# Patient Record
Sex: Male | Born: 1965
Health system: Southern US, Community
[De-identification: ages and names within clinical notes are randomized; demographics above are authoritative.]

## PROBLEM LIST (undated history)

## (undated) DIAGNOSIS — T7840XA Allergy, unspecified, initial encounter: Secondary | ICD-10-CM

## (undated) DIAGNOSIS — K5792 Diverticulitis of intestine, part unspecified, without perforation or abscess without bleeding: Secondary | ICD-10-CM

## (undated) HISTORY — DX: Allergy, unspecified, initial encounter: T78.40XA

## (undated) HISTORY — DX: Diverticulitis of intestine, part unspecified, without perforation or abscess without bleeding: K57.92

---

## 1998-06-26 ENCOUNTER — Encounter: Payer: Self-pay | Admitting: Family Medicine

## 1998-06-26 ENCOUNTER — Ambulatory Visit (HOSPITAL_COMMUNITY): Admission: RE | Admit: 1998-06-26 | Discharge: 1998-06-26 | Payer: Self-pay | Admitting: Family Medicine

## 1999-06-26 ENCOUNTER — Emergency Department (HOSPITAL_COMMUNITY): Admission: EM | Admit: 1999-06-26 | Discharge: 1999-06-27 | Payer: Self-pay | Admitting: Internal Medicine

## 2002-12-13 ENCOUNTER — Ambulatory Visit (HOSPITAL_BASED_OUTPATIENT_CLINIC_OR_DEPARTMENT_OTHER): Admission: RE | Admit: 2002-12-13 | Discharge: 2002-12-13 | Payer: Self-pay | Admitting: Plastic Surgery

## 2002-12-13 ENCOUNTER — Encounter (INDEPENDENT_AMBULATORY_CARE_PROVIDER_SITE_OTHER): Payer: Self-pay | Admitting: Specialist

## 2005-04-18 ENCOUNTER — Ambulatory Visit: Payer: Self-pay | Admitting: Family Medicine

## 2005-05-13 ENCOUNTER — Ambulatory Visit: Payer: Self-pay | Admitting: Family Medicine

## 2005-05-14 ENCOUNTER — Ambulatory Visit: Payer: Self-pay | Admitting: Family Medicine

## 2005-08-15 ENCOUNTER — Ambulatory Visit: Payer: Self-pay | Admitting: Family Medicine

## 2006-03-12 ENCOUNTER — Ambulatory Visit: Payer: Self-pay | Admitting: Family Medicine

## 2006-04-06 ENCOUNTER — Ambulatory Visit: Payer: Self-pay | Admitting: Family Medicine

## 2006-04-07 HISTORY — PX: UMBILICAL HERNIA REPAIR: SHX196

## 2006-05-22 ENCOUNTER — Ambulatory Visit (HOSPITAL_COMMUNITY): Admission: RE | Admit: 2006-05-22 | Discharge: 2006-05-22 | Payer: Self-pay | Admitting: General Surgery

## 2006-07-27 ENCOUNTER — Ambulatory Visit: Payer: Self-pay | Admitting: Family Medicine

## 2006-12-23 ENCOUNTER — Ambulatory Visit: Payer: Self-pay | Admitting: Family Medicine

## 2006-12-23 DIAGNOSIS — J309 Allergic rhinitis, unspecified: Secondary | ICD-10-CM | POA: Insufficient documentation

## 2006-12-23 DIAGNOSIS — J019 Acute sinusitis, unspecified: Secondary | ICD-10-CM | POA: Insufficient documentation

## 2007-02-23 ENCOUNTER — Ambulatory Visit: Payer: Self-pay | Admitting: Family Medicine

## 2007-05-04 ENCOUNTER — Telehealth: Payer: Self-pay | Admitting: Family Medicine

## 2007-05-10 ENCOUNTER — Ambulatory Visit: Payer: Self-pay | Admitting: Family Medicine

## 2007-05-10 DIAGNOSIS — J45909 Unspecified asthma, uncomplicated: Secondary | ICD-10-CM | POA: Insufficient documentation

## 2007-05-10 DIAGNOSIS — J209 Acute bronchitis, unspecified: Secondary | ICD-10-CM | POA: Insufficient documentation

## 2007-05-19 ENCOUNTER — Telehealth: Payer: Self-pay | Admitting: Family Medicine

## 2007-05-31 ENCOUNTER — Ambulatory Visit: Payer: Self-pay | Admitting: Family Medicine

## 2008-01-07 ENCOUNTER — Ambulatory Visit: Payer: Self-pay | Admitting: Family Medicine

## 2008-01-24 ENCOUNTER — Telehealth: Payer: Self-pay | Admitting: Family Medicine

## 2008-04-21 ENCOUNTER — Telehealth: Payer: Self-pay | Admitting: Family Medicine

## 2008-04-24 ENCOUNTER — Ambulatory Visit: Payer: Self-pay | Admitting: Family Medicine

## 2008-04-24 DIAGNOSIS — IMO0002 Reserved for concepts with insufficient information to code with codable children: Secondary | ICD-10-CM | POA: Insufficient documentation

## 2008-04-28 ENCOUNTER — Telehealth: Payer: Self-pay | Admitting: Family Medicine

## 2009-01-10 ENCOUNTER — Telehealth: Payer: Self-pay | Admitting: Family Medicine

## 2009-01-16 ENCOUNTER — Telehealth: Payer: Self-pay | Admitting: Family Medicine

## 2009-01-18 ENCOUNTER — Encounter: Payer: Self-pay | Admitting: Family Medicine

## 2009-05-02 ENCOUNTER — Telehealth: Payer: Self-pay | Admitting: Family Medicine

## 2010-03-06 ENCOUNTER — Ambulatory Visit: Payer: Self-pay | Admitting: Family Medicine

## 2010-03-06 LAB — CONVERTED CEMR LAB
Bilirubin Urine: NEGATIVE
Blood in Urine, dipstick: NEGATIVE
Glucose, Urine, Semiquant: NEGATIVE
Ketones, urine, test strip: NEGATIVE
Nitrite: NEGATIVE
Protein, U semiquant: NEGATIVE
Specific Gravity, Urine: 1.015
Urobilinogen, UA: 0.2
WBC Urine, dipstick: NEGATIVE
pH: 6

## 2010-03-07 LAB — CONVERTED CEMR LAB
ALT: 19 units/L (ref 0–53)
AST: 24 units/L (ref 0–37)
Albumin: 4.2 g/dL (ref 3.5–5.2)
Alkaline Phosphatase: 70 units/L (ref 39–117)
BUN: 15 mg/dL (ref 6–23)
Basophils Absolute: 0 10*3/uL (ref 0.0–0.1)
Basophils Relative: 0.4 % (ref 0.0–3.0)
Bilirubin, Direct: 0.2 mg/dL (ref 0.0–0.3)
CO2: 32 meq/L (ref 19–32)
Calcium: 9.3 mg/dL (ref 8.4–10.5)
Chloride: 102 meq/L (ref 96–112)
Cholesterol: 235 mg/dL — ABNORMAL HIGH (ref 0–200)
Creatinine, Ser: 0.9 mg/dL (ref 0.4–1.5)
Direct LDL: 154.2 mg/dL
Eosinophils Absolute: 0.2 10*3/uL (ref 0.0–0.7)
Eosinophils Relative: 4.3 % (ref 0.0–5.0)
GFR calc non Af Amer: 94.83 mL/min (ref >60)
Glucose, Bld: 91 mg/dL (ref 70–99)
HCT: 44.8 % (ref 39.0–52.0)
HDL: 57 mg/dL (ref >39.00)
Hemoglobin: 15.6 g/dL (ref 13.0–17.0)
Lymphocytes Relative: 29 % (ref 12.0–46.0)
Lymphs Abs: 1.4 10*3/uL (ref 0.7–4.0)
MCHC: 34.9 g/dL (ref 30.0–36.0)
MCV: 91.3 fL (ref 78.0–100.0)
Monocytes Absolute: 0.4 10*3/uL (ref 0.1–1.0)
Monocytes Relative: 8.8 % (ref 3.0–12.0)
Neutro Abs: 2.7 10*3/uL (ref 1.4–7.7)
Neutrophils Relative %: 57.5 % (ref 43.0–77.0)
Platelets: 201 10*3/uL (ref 150.0–400.0)
Potassium: 4.2 meq/L (ref 3.5–5.1)
RBC: 4.9 M/uL (ref 4.22–5.81)
RDW: 12.5 % (ref 11.5–14.6)
Sodium: 142 meq/L (ref 135–145)
TSH: 1.99 microintl units/mL (ref 0.35–5.50)
Total Bilirubin: 1.7 mg/dL — ABNORMAL HIGH (ref 0.3–1.2)
Total CHOL/HDL Ratio: 4
Total Protein: 7.1 g/dL (ref 6.0–8.3)
Triglycerides: 112 mg/dL (ref 0.0–149.0)
VLDL: 22.4 mg/dL (ref 0.0–40.0)
WBC: 4.7 10*3/uL (ref 4.5–10.5)

## 2010-03-19 ENCOUNTER — Telehealth: Payer: Self-pay | Admitting: Family Medicine

## 2010-04-16 ENCOUNTER — Telehealth: Payer: Self-pay | Admitting: Family Medicine

## 2010-05-07 NOTE — Assessment & Plan Note (Signed)
Summary: CPX (PT WILL COME IN FASTING) // RS   Vital Signs:  Patient profile:   45 year old male Height:      69 inches Weight:      166 pounds BMI:     24.60 O2 Sat:      99 % Temp:     98 degrees F Pulse rate:   73 / minute BP sitting:   120 / 80  (left arm) Cuff size:   regular  Vitals Entered By: Pura Spice, RN (March 06, 2010 9:08 AM) CC: cpx sinus problems    History of Present Illness: 45 yr old male for a cpx. He feels fine and has no complaints.   Preventive Screening-Counseling & Management  Alcohol-Tobacco     Smoking Status: never  Allergies: 1)  ! Penicillin  Past History:  Past Medical History: Allergic rhinitis Asthma acne, sees Dr. Elmon Else   Past Surgical History: Reviewed history from 04/24/2008 and no changes required. umbilical hernia repair 2007 Hand surgery (repair nerve and ligaments) Arthroscopy to lt knee  Family History: Reviewed history and no changes required. Family History High cholesterol Family History Hypertension  Social History: Reviewed history and no changes required. Married Never Smoked Alcohol use-yes Smoking Status:  never  Review of Systems  The patient denies anorexia, fever, weight loss, weight gain, vision loss, decreased hearing, hoarseness, chest pain, syncope, dyspnea on exertion, peripheral edema, prolonged cough, headaches, hemoptysis, abdominal pain, melena, hematochezia, severe indigestion/heartburn, hematuria, incontinence, genital sores, muscle weakness, suspicious skin lesions, transient blindness, difficulty walking, depression, unusual weight change, abnormal bleeding, enlarged lymph nodes, angioedema, breast masses, and testicular masses.    Physical Exam  General:  Well-developed,well-nourished,in no acute distress; alert,appropriate and cooperative throughout examination Head:  Normocephalic and atraumatic without obvious abnormalities. No apparent alopecia or balding. Eyes:  No corneal  or conjunctival inflammation noted. EOMI. Perrla. Funduscopic exam benign, without hemorrhages, exudates or papilledema. Vision grossly normal. Ears:  External ear exam shows no significant lesions or deformities.  Otoscopic examination reveals clear canals, tympanic membranes are intact bilaterally without bulging, retraction, inflammation or discharge. Hearing is grossly normal bilaterally. Nose:  External nasal examination shows no deformity or inflammation. Nasal mucosa are pink and moist without lesions or exudates. Mouth:  Oral mucosa and oropharynx without lesions or exudates.  Teeth in good repair. Neck:  No deformities, masses, or tenderness noted. Chest Wall:  No deformities, masses, tenderness or gynecomastia noted. Lungs:  Normal respiratory effort, chest expands symmetrically. Lungs are clear to auscultation, no crackles or wheezes. Heart:  Normal rate and regular rhythm. S1 and S2 normal without gallop, murmur, click, rub or other extra sounds. Abdomen:  Bowel sounds positive,abdomen soft and non-tender without masses, organomegaly or hernias noted. Genitalia:  Testes bilaterally descended without nodularity, tenderness or masses. No scrotal masses or lesions. No penis lesions or urethral discharge. Msk:  No deformity or scoliosis noted of thoracic or lumbar spine.   Pulses:  R and L carotid,radial,femoral,dorsalis pedis and posterior tibial pulses are full and equal bilaterally Extremities:  No clubbing, cyanosis, edema, or deformity noted with normal full range of motion of all joints.   Neurologic:  No cranial nerve deficits noted. Station and gait are normal. Plantar reflexes are down-going bilaterally. DTRs are symmetrical throughout. Sensory, motor and coordinative functions appear intact. Skin:  Intact without suspicious lesions or rashes Cervical Nodes:  No lymphadenopathy noted Axillary Nodes:  No palpable lymphadenopathy Inguinal Nodes:  No significant adenopathy Psych:  Cognition and judgment appear intact. Alert and cooperative with normal attention span and concentration. No apparent delusions, illusions, hallucinations   Impression & Recommendations:  Problem # 1:  HEALTH MAINTENANCE EXAM (ICD-V70.0)  Orders: UA Dipstick w/o Micro (automated)  (81003) Venipuncture (44010) TLB-Lipid Panel (80061-LIPID) TLB-BMP (Basic Metabolic Panel-BMET) (80048-METABOL) TLB-CBC Platelet - w/Differential (85025-CBCD) TLB-Hepatic/Liver Function Pnl (80076-HEPATIC) TLB-TSH (Thyroid Stimulating Hormone) (84443-TSH) Specimen Handling (27253)  Complete Medication List: 1)  Ventolin Hfa 108 (90 Base) Mcg/act Aers (Albuterol sulfate) .... 2 puffs q 4 hours as needed 2)  Flonase 50 Mcg/act Susp (Fluticasone propionate) .... 2 sprays each nostril once daily 3)  Singulair 10 Mg Tabs (Montelukast sodium) .... Once daily 4)  Allegra-d 24 Hour 180-240 Mg Xr24h-tab (Fexofenadine-pseudoephedrine) .Marland Kitchen.. 1 by mouth once daily 5)  Minocycline Hcl 100 Mg Caps (Minocycline hcl) .... Two times a day  Patient Instructions: 1)  Please schedule a follow-up appointment as needed .  Prescriptions: VENTOLIN HFA 108 (90 BASE) MCG/ACT  AERS (ALBUTEROL SULFATE) 2 puffs q 4 hours as needed  #1 x 5   Entered and Authorized by:   Nelwyn Salisbury MD   Signed by:   Nelwyn Salisbury MD on 03/06/2010   Method used:   Electronically to        [facility] [address] [phone]* (retail)       [address] [place], [place]  [address]       Ph: [phone]       Fax: [phone]   RxID:   [id number]    Orders Added: 1)  Est. Patient 40-64 years [99396] 2)  UA Dipstick w/o Micro (automated)  [81003] 3)  Venipuncture [57322] 4)  TLB-Lipid Panel [80061-LIPID] 5)  TLB-BMP (Basic Metabolic Panel-BMET) [80048-METABOL] 6)  TLB-CBC Platelet - w/Differential [85025-CBCD] 7)  TLB-Hepatic/Liver Function Pnl [80076-HEPATIC] 8)  TLB-TSH (Thyroid Stimulating Hormone) [84443-TSH] 9)  Specimen  Handling [99000]    Laboratory Results   Urine Tests  Date/Time Received: March 06, 2010   Routine Urinalysis   Color: yellow Appearance: Clear Glucose: negative   (Normal Range: Negative) Bilirubin: negative   (Normal Range: Negative) Ketone: negative   (Normal Range: Negative) Spec. Gravity: 1.015   (Normal Range: 1.003-1.035) Blood: negative   (Normal Range: Negative) pH: 6.0   (Normal Range: 5.0-8.0) Protein: negative   (Normal Range: Negative) Urobilinogen: 0.2   (Normal Range: 0-1) Nitrite: negative   (Normal Range: Negative) Leukocyte Esterace: negative   (Normal Range: Negative)    Comments: Kern Reap CMA (AAMA)  March 06, 2010 1:33 PM

## 2010-05-07 NOTE — Progress Notes (Signed)
Summary: refill zpak?  Phone Note Call from Patient Call back at Home Phone [phone]   Caller: Patient-live call Summary of Call: Pt calls with his typical sinus infection. Cannot come in today. Has a flight to catch in the morning . Requesting a refill of Z[facility]ak to [facility] on [address]. Please advise. Initial call taken by: Warnell Forester,  May 02, 2009 9:35 AM  Follow-up for Phone Call        call in a Zpack Follow-up by: Nelwyn Salisbury MD,  May 02, 2009 1:35 PM  Additional Follow-up for Phone Call Additional follow up Details #1::        Phone Call Completed, Rx Called In Additional Follow-up by: Alfred Levins, CMA,  May 02, 2009 1:56 PM    New/Updated Medications: ZITHROMAX Z-PAK 250 MG  TABS (AZITHROMYCIN) Use as directed Prescriptions: ZITHROMAX Z-PAK 250 MG  TABS (AZITHROMYCIN) Use as directed  #1 x 0   Entered by:   Alfred Levins, CMA   Authorized by:   Nelwyn Salisbury MD   Signed by:   Alfred Levins, CMA on 05/02/2009   Method used:   Electronically to        [facility]. [phone]* (retail)       [address] [place], [place]  [address]       Ph: [phone]       Fax: [phone]   RxID:   [id number]

## 2010-05-09 NOTE — Progress Notes (Signed)
Summary: Brent Bennett INTO [facility]  Phone Note Call from Patient Call back at Home Phone [phone]   Caller: Patient Call For: Nelwyn Salisbury MD Summary of Call: PT IS STILL HAVING SINUS PROBLEMS  REQUESTING [name] [address] [phone] Initial call taken by: Heron Sabins,  March 19, 2010 9:36 AM  Follow-up for Phone Call        call in a Zpack Follow-up by: Nelwyn Salisbury MD,  March 19, 2010 12:00 PM    New/Updated Medications: ZITHROMAX Z-PAK 250 MG TABS (AZITHROMYCIN) as directed Prescriptions: ZITHROMAX Z-PAK 250 MG TABS (AZITHROMYCIN) as directed  #6 x 0   Entered by:   Lynann Beaver CMA AAMA   Authorized by:   Nelwyn Salisbury MD   Signed by:   Lynann Beaver CMA AAMA on 03/19/2010   Method used:   Electronically to        [facility] [address] [phone]* (retail)       [address] [place], [place]  [address]       Ph: [phone]       Fax: [phone]   RxID:   [id number]

## 2010-05-09 NOTE — Progress Notes (Signed)
Summary: rx z pack   Phone Note Call from Patient Call back at Home Phone [phone]   Caller: Patient Summary of Call: out of town. Having typical sinus infection symptoms with pressure, PND, ST, and a cough. No fever.  Initial call taken by: Nelwyn Salisbury MD,  April 16, 2010 9:39 AM  Follow-up for Phone Call        call in a Zpack to [facility] [address].  Follow-up by: Nelwyn Salisbury MD,  April 16, 2010 9:40 AM  Additional Follow-up for Phone Call Additional follow up Details #1::        pt notified  called [facility]  Additional Follow-up by: Pura Spice, RN,  April 16, 2010 10:03 AM    New/Updated Medications: AZITHROMYCIN 250 MG  TABS (AZITHROMYCIN) 2 by  mouth today and then 1 daily for 4 days Prescriptions: AZITHROMYCIN 250 MG  TABS (AZITHROMYCIN) 2 by  mouth today and then 1 daily for 4 days  #6 x 0   Entered by:   Pura Spice, RN   Authorized by:   Nelwyn Salisbury MD   Signed by:   Pura Spice, RN on 04/16/2010   Method used:   Electronically to        [facility] [address] [phone]* (retail)       [address] [place], [place]  [address]       Ph: [phone]       Fax: [phone]   RxID:   [id number]

## 2010-05-17 ENCOUNTER — Other Ambulatory Visit: Payer: Self-pay | Admitting: Dermatology

## 2010-05-21 ENCOUNTER — Ambulatory Visit (INDEPENDENT_AMBULATORY_CARE_PROVIDER_SITE_OTHER): Payer: Self-pay | Admitting: Internal Medicine

## 2010-05-21 ENCOUNTER — Encounter: Payer: Self-pay | Admitting: Internal Medicine

## 2010-05-21 DIAGNOSIS — H6690 Otitis media, unspecified, unspecified ear: Secondary | ICD-10-CM

## 2010-05-21 DIAGNOSIS — H6692 Otitis media, unspecified, left ear: Secondary | ICD-10-CM

## 2010-05-21 MED ORDER — LEVOFLOXACIN 500 MG PO TABS: 500.0000 mg | ORAL_TABLET | Freq: Every day | ORAL | Status: AC

## 2010-05-21 NOTE — Progress Notes (Signed)
  Subjective:    Patient ID: Brent Bennett, male    DOB: 03/18/1966, 45 y.o.   MRN: [id number]  HPI Pt presents to clinic for evaluation of ear discomfort. Notes chronic h/o allergic rhinitis with recent increase in congestion. Several days ago blew his nose and noted left ear pressure and decreased hearing. No ear drainage, fever or chills. No injury or trauma. No alleviating or exacerbating factors. No other complaints.  Reviewed PMH, medications and allergies    Review of Systems  Constitutional: Negative for fever and chills.  HENT: Positive for hearing loss, ear pain, congestion and rhinorrhea. Negative for facial swelling and ear discharge.   Eyes: Negative for discharge and redness.  Respiratory: Negative for cough.        Objective:   Physical Exam  Constitutional: He appears well-developed and well-nourished. No distress.  HENT:  Head: Normocephalic and atraumatic.  Right Ear: Tympanic membrane, external ear and ear canal normal. No lacerations. No drainage, swelling or tenderness. No foreign bodies. Tympanic membrane is not erythematous and not retracted.  Left Ear: External ear and ear canal normal. No lacerations. No drainage, swelling or tenderness. No foreign bodies. Tympanic membrane is erythematous and retracted.  Ears:  Nose: Nose normal.  Mouth/Throat: Oropharynx is clear and moist. No oropharyngeal exudate.       Significant TM erythema with area of central clearing. +light reflex along central area NOT c/w perforation.  Eyes: Right eye exhibits no discharge. Left eye exhibits no discharge. No scleral icterus.  Neurological: He is alert.  Skin: Skin is warm and dry. He is not diaphoretic.  Psychiatric: He has a normal mood and affect.          Assessment & Plan:

## 2010-05-21 NOTE — Assessment & Plan Note (Signed)
Begin levaquin 500mg  po qd x7d. Followup closely for re-evaluation if no improvement or worsening.

## 2010-05-24 ENCOUNTER — Other Ambulatory Visit: Payer: Self-pay | Admitting: Family Medicine

## 2010-05-24 MED ORDER — FLUTICASONE PROPIONATE 50 MCG/ACT NA SUSP: 2.0000 | Freq: Every day | NASAL | Status: DC

## 2010-05-24 NOTE — Telephone Encounter (Signed)
Triage vm-----please refill Flonase NS.  RX has expired. It is over a year old. Please return call.

## 2010-05-24 NOTE — Telephone Encounter (Signed)
Pt notiifed flonase called to [facility] [address]

## 2010-05-30 ENCOUNTER — Other Ambulatory Visit: Payer: Self-pay | Admitting: Family Medicine

## 2010-07-09 ENCOUNTER — Other Ambulatory Visit: Payer: Self-pay | Admitting: Family Medicine

## 2010-08-23 NOTE — Op Note (Signed)
   NAME:  Brent Bennett, Brent Bennett                      ACCOUNT NO.:  [id number]   MEDICAL RECORD NO.:  [id number]                   PATIENT TYPE:  AMB   LOCATION:  DSC                                  FACILITY:  [facility]   PHYSICIAN:  Alfredia Ferguson, M.D.               DATE OF BIRTH:  09/27/1965   DATE OF PROCEDURE:  12/13/2002  DATE OF DISCHARGE:                                 OPERATIVE REPORT   PREOPERATIVE DIAGNOSIS:  9 mm biopsy-proven dysplastic nevus right lower  cheek.   POSTOPERATIVE DIAGNOSIS:  9 mm biopsy-proven dysplastic nevus right lower  cheek.   PROCEDURE:  Elliptical excision of biopsy site of dysplastic nevus right  cheek with 3 mm margins.   SURGEON:  Alfredia Ferguson, M.D.   ANESTHESIA:  2% Xylocaine with 1:100,000 epinephrine.   INDICATIONS FOR PROCEDURE:  This is a 45 year old gentleman with a recent  biopsy of a pigmented nevus on his right lower cheek.  Pathology returned  dysplastic nevus with marked atypical bordering on melanoma in situ.  Recommendation for conservative reexcision was made.  The patient  understands he will be trading what he has for a permanent and potentially  unsightly scar.  Inspite of that, the patient wishes to proceed with the  surgery.   DESCRIPTION OF PROCEDURE:  Skin marks were placed around the lesion in an  elliptical fashion with 3 mm margins.  Local anesthesia using 2% Xylocaine  with 1:100,000 epinephrine was infiltrated.  The right cheek was prepped  with Betadine and draped with sterile drapes.  After waiting approximately  10 minutes, an elliptical excision of the lesion down to the level of the  subcutaneous tissue was carried out. Hemostasis was accomplished using  pressure. The wound edges were undermined for a distance of several mm in  all directions.  The wound was closed by approximating the dermis using  multiple interrupted 4-0 Monocryl sutures.  Skin was united using a running  6-0 nylon suture.  A light dressing  was applied and the patient was  discharged to home in satisfactory condition.                                               Alfredia Ferguson, M.D.    WBB/MEDQ  D:  12/13/2002  T:  12/13/2002  Job:  [id number]   cc:   Elmon Else, M.D.

## 2010-08-23 NOTE — Op Note (Signed)
Brent Bennett, Brent Bennett            ACCOUNT NO.:  [id number]   MEDICAL RECORD NO.:  [id number]          PATIENT TYPE:  AMB   LOCATION:  SDS                          FACILITY:  [facility]   PHYSICIAN:  Ollen Gross. Vernell Morgans, M.D. DATE OF BIRTH:  Sep 21, 1965   DATE OF PROCEDURE:  05/22/2006  DATE OF DISCHARGE:                               OPERATIVE REPORT   PREOPERATIVE DIAGNOSIS:  Umbilical hernia.   POSTOPERATIVE DIAGNOSES:  Umbilical hernia.   PROCEDURE:  Umbilical hernia repair with mesh.   SURGEON:  Dr. Carolynne Edouard   ANESTHESIA:  General endotracheal.   PROCEDURE:  After informed consent was obtained, the patient was brought  to the operating room, placed in supine position on the operating room  table.  After adequate induction of general anesthesia, the patient's  abdomen was prepped with Betadine and draped in usual sterile manner.  The area around the umbilicus was infiltrated with 0.25% Marcaine with  epinephrine.  A small transverse supraumbilical incision was made with a  15 blade knife.  This incision was carried down through the skin and  subcutaneous tissue sharply with electrocautery until the linea alba was  identified.  There was a small bit of preperitoneal fat that had  herniated through a small defect at the umbilicus and this was excised  sharply with electrocautery.  The patient actually had two or three  other very small 1-2 mm defects just within a couple of millimeters of  the umbilical defect.  All of these defects were again 1-2 mm.  Because  there were several defects, each of the defects was connected and the  fascial bridges between were opened sharply with electrocautery.  The  edges were cleaned of any fatty debris, again sharply with the  electrocautery, as well as with some blunt finger dissection.  Once this  was accomplished, the edges of the defect were very clean and healthy  appearing.  The defect itself was then closed with a with interrupted #1  Novofil  stitches.  The wound was irrigated with copious amounts of  saline.  A small piece of Ultrapro mesh was then chosen to cover the  repair.  The mesh was anchored in six places around the edge of the mesh  with interrupted #1 Novofil stitches and a couple of places along the  repair also with interrupted #1 Novofil stitches.  Once this  accomplished, the wound again was irrigated copious amounts of saline.  The deep layer of the wound was then closed with interrupted 3-0 Vicryl  stitches and the skin was closed with a running 4-0 Monocryl  subcuticular stitch.  Benzoin, Steri-Strips and sterile dressings were  applied.    The patient tolerated well.  At the end of the case all needle, sponge  and instrument counts were correct.  The patient was then awakened,  taken recovery room in stable condition.     Ollen Gross. Vernell Morgans, M.D.  Electronically Signed    PST/MEDQ  D:  05/22/2006  T:  05/22/2006  Job:  [id number]

## 2010-11-04 ENCOUNTER — Other Ambulatory Visit: Payer: Self-pay | Admitting: Family Medicine

## 2010-11-04 NOTE — Telephone Encounter (Signed)
Script sent, e-scribe. 

## 2010-11-11 ENCOUNTER — Ambulatory Visit (INDEPENDENT_AMBULATORY_CARE_PROVIDER_SITE_OTHER): Payer: BC Managed Care – PPO | Admitting: Family Medicine

## 2010-11-11 ENCOUNTER — Encounter: Payer: Self-pay | Admitting: Family Medicine

## 2010-11-11 VITALS — BP 112/78 | HR 71 | Temp 98.3°F | Wt 168.0 lb

## 2010-11-11 DIAGNOSIS — L259 Unspecified contact dermatitis, unspecified cause: Secondary | ICD-10-CM

## 2010-11-11 DIAGNOSIS — L0390 Cellulitis, unspecified: Secondary | ICD-10-CM

## 2010-11-11 DIAGNOSIS — L0291 Cutaneous abscess, unspecified: Secondary | ICD-10-CM

## 2010-11-11 MED ORDER — DOXYCYCLINE HYCLATE 100 MG PO CAPS: 100.0000 mg | ORAL_CAPSULE | Freq: Two times a day (BID) | ORAL | Status: AC

## 2010-11-11 NOTE — Progress Notes (Signed)
  Subjective:    Patient ID: Brent Bennett, male    DOB: 06/24/1965, 45 y.o.   MRN: [id number]  HPI Here for an itchy rash which appeared on the forearms and face 2 days ago after working in the yard. Then yesterday the area on the chin became red, swollen, and tender. No fever.    Review of Systems  Constitutional: Negative.   Skin: Positive for rash.       Objective:   Physical Exam  Constitutional: He appears well-developed and well-nourished.  Skin:       The left lower cheek and the chin have patches of erythema, yellow crusting, and tenderness. The forearms have patches of red papulovessicular rashes           Assessment & Plan:  He has contact dermatitis, with an area on the chin that has a secondary cellulitis. Use OTC cortisone cream on the arms. Use Doxycycline for the infection.

## 2011-03-05 ENCOUNTER — Telehealth: Payer: Self-pay | Admitting: Family Medicine

## 2011-03-05 MED ORDER — AZITHROMYCIN 250 MG PO TABS: ORAL_TABLET | ORAL | Status: AC

## 2011-03-05 NOTE — Telephone Encounter (Signed)
Script called in and left voice message for pt. 

## 2011-03-05 NOTE — Telephone Encounter (Signed)
He is calling in about a sinus infection. Please call  in a Zpack

## 2011-03-20 ENCOUNTER — Telehealth: Payer: Self-pay | Admitting: Family Medicine

## 2011-03-20 MED ORDER — AZITHROMYCIN 250 MG PO TABS: ORAL_TABLET | ORAL | Status: DC

## 2011-03-20 NOTE — Telephone Encounter (Signed)
He needs another Zpack for the sinus infection. He is better but not well yet. Please call in another Zpack

## 2011-03-20 NOTE — Telephone Encounter (Signed)
Script called in and pt aware. 

## 2011-03-22 ENCOUNTER — Encounter: Payer: Self-pay | Admitting: Family Medicine

## 2011-03-22 ENCOUNTER — Ambulatory Visit (INDEPENDENT_AMBULATORY_CARE_PROVIDER_SITE_OTHER): Payer: BC Managed Care – PPO | Admitting: Family Medicine

## 2011-03-22 DIAGNOSIS — J329 Chronic sinusitis, unspecified: Secondary | ICD-10-CM

## 2011-03-22 DIAGNOSIS — R6889 Other general symptoms and signs: Secondary | ICD-10-CM

## 2011-03-22 MED ORDER — DOXYCYCLINE HYCLATE 100 MG PO TABS: 100.0000 mg | ORAL_TABLET | Freq: Two times a day (BID) | ORAL | Status: AC

## 2011-03-22 MED ORDER — OSELTAMIVIR PHOSPHATE 75 MG PO CAPS: 75.0000 mg | ORAL_CAPSULE | Freq: Two times a day (BID) | ORAL | Status: AC

## 2011-03-22 NOTE — Assessment & Plan Note (Signed)
Pt's sxs and PE consistent w/ infxn.  Stop Azithro and switch to Doxy.  Reviewed supportive care and red flags that should prompt return.  Pt expressed understanding and is in agreement w/ plan.

## 2011-03-22 NOTE — Patient Instructions (Signed)
This is a sinus infection but also possibly flu Start the Tamiflu twice daily x5 days STOP the Zpack START the Doxycycline twice daily w/ food Drink plenty of fluids Mucinex to thin your congestion REST! Hang in there! Happy Holidays!

## 2011-03-22 NOTE — Progress Notes (Signed)
  Subjective:    Patient ID: Brent Bennett, male    DOB: Aug 19, 1965, 45 y.o.   MRN: [id number]  HPI ? Flu- prone to sinus infxns, typically improves w/ Zpack.  Called Dr Clent Ridges on 12/13 for Zpack.  Yesterday congestion improved but now having sore throat, ear pain, Tm 101, body aches.  + tooth pain, skin pain.  + sick contacts.  Wife is currently pregnant.   Review of Systems For ROS see HPI     Objective:   Physical Exam  Vitals reviewed. Constitutional: He appears well-developed and well-nourished. No distress.  HENT:  Head: Normocephalic and atraumatic.  Right Ear: Tympanic membrane normal.  Left Ear: Tympanic membrane normal.  Nose: Mucosal edema and rhinorrhea present. Right sinus exhibits maxillary sinus tenderness and frontal sinus tenderness. Left sinus exhibits maxillary sinus tenderness and frontal sinus tenderness.  Mouth/Throat: Mucous membranes are normal. Oropharyngeal exudate and posterior oropharyngeal erythema present. No posterior oropharyngeal edema.       + PND  Eyes: Conjunctivae and EOM are normal. Pupils are equal, round, and reactive to light.  Neck: Normal range of motion. Neck supple.  Cardiovascular: Normal rate, regular rhythm and normal heart sounds.   Pulmonary/Chest: Effort normal and breath sounds normal. No respiratory distress. He has no wheezes.       + hacking cough  Lymphadenopathy:    He has no cervical adenopathy.  Skin: Skin is warm and dry.          Assessment & Plan:

## 2011-03-22 NOTE — Assessment & Plan Note (Signed)
Pt's body aches, cough, and fever consistent w/ possible flu.  Given that wife is pregnant, start Tamiflu.  Reviewed supportive care and red flags that should prompt return.  Pt expressed understanding and is in agreement w/ plan.

## 2011-05-21 ENCOUNTER — Other Ambulatory Visit: Payer: Self-pay | Admitting: Family Medicine

## 2011-06-30 ENCOUNTER — Other Ambulatory Visit: Payer: Self-pay | Admitting: Family Medicine

## 2011-08-31 ENCOUNTER — Other Ambulatory Visit: Payer: Self-pay | Admitting: Family Medicine

## 2011-10-31 ENCOUNTER — Telehealth: Payer: Self-pay | Admitting: Family Medicine

## 2011-10-31 MED ORDER — AZITHROMYCIN 250 MG PO TABS: ORAL_TABLET | ORAL | Status: AC

## 2011-10-31 NOTE — Telephone Encounter (Signed)
He has another sinus infection  

## 2011-11-14 ENCOUNTER — Encounter: Payer: Self-pay | Admitting: Family Medicine

## 2011-11-14 ENCOUNTER — Ambulatory Visit (INDEPENDENT_AMBULATORY_CARE_PROVIDER_SITE_OTHER): Payer: BC Managed Care – PPO | Admitting: Family Medicine

## 2011-11-14 VITALS — BP 110/80 | Temp 98.2°F | Wt 171.0 lb

## 2011-11-14 DIAGNOSIS — J329 Chronic sinusitis, unspecified: Secondary | ICD-10-CM

## 2011-11-14 MED ORDER — LEVOFLOXACIN 500 MG PO TABS: 500.0000 mg | ORAL_TABLET | Freq: Every day | ORAL | Status: AC

## 2011-11-14 NOTE — Progress Notes (Signed)
  Subjective:    Patient ID: Brent Bennett, male    DOB: 14-Feb-1966, 46 y.o.   MRN: [id number]  HPI Here for continued sinus symptoms after 4 weeks. He took a Zpack which helped for a few days. He still has sinus pressure, ear pressure, PND, and a ST. No cough or fever.    Review of Systems  Constitutional: Negative.   HENT: Positive for congestion, postnasal drip and sinus pressure.   Eyes: Negative.   Respiratory: Negative.        Objective:   Physical Exam  Constitutional: He appears well-developed and well-nourished.  HENT:  Right Ear: External ear normal.  Left Ear: External ear normal.  Nose: Nose normal.  Mouth/Throat: Oropharynx is clear and moist. No oropharyngeal exudate.  Eyes: Conjunctivae are normal.  Pulmonary/Chest: Effort normal and breath sounds normal.  Lymphadenopathy:    He has no cervical adenopathy.          Assessment & Plan:  Treat with Levaquin. Recheck prn

## 2012-01-12 ENCOUNTER — Other Ambulatory Visit: Payer: Self-pay | Admitting: Family Medicine

## 2012-04-02 ENCOUNTER — Telehealth: Payer: Self-pay | Admitting: Family Medicine

## 2012-04-02 MED ORDER — AZITHROMYCIN 250 MG PO TABS: ORAL_TABLET | ORAL | Status: DC

## 2012-04-02 NOTE — Telephone Encounter (Signed)
He asks for a med for a sinus infection

## 2012-04-15 ENCOUNTER — Telehealth: Payer: Self-pay | Admitting: Family Medicine

## 2012-04-15 MED ORDER — AZITHROMYCIN 250 MG PO TABS: ORAL_TABLET | ORAL | Status: DC

## 2012-04-15 NOTE — Telephone Encounter (Signed)
He needs another round of antibiotics. I told him if this is not successful he would need an OV

## 2012-05-24 ENCOUNTER — Encounter: Payer: Self-pay | Admitting: Family Medicine

## 2012-05-24 ENCOUNTER — Ambulatory Visit (INDEPENDENT_AMBULATORY_CARE_PROVIDER_SITE_OTHER): Payer: BC Managed Care – PPO | Admitting: Family Medicine

## 2012-05-24 VITALS — BP 114/72 | HR 75 | Temp 98.2°F | Wt 170.0 lb

## 2012-05-24 DIAGNOSIS — J019 Acute sinusitis, unspecified: Secondary | ICD-10-CM

## 2012-05-24 MED ORDER — LEVOFLOXACIN 500 MG PO TABS: 500.0000 mg | ORAL_TABLET | Freq: Every day | ORAL | Status: AC

## 2012-05-24 NOTE — Progress Notes (Signed)
  Subjective:    Patient ID: Brent Bennett, male    DOB: Nov 17, 1965, 47 y.o.   MRN: [id number]  HPI Here for one week of sinus pressure, PND, ST, and ear pain. No cough. Had a fever of 100 degrees last night. On Allegra D.    Review of Systems  Constitutional: Positive for fever.  HENT: Positive for ear pain, congestion, postnasal drip and sinus pressure.   Eyes: Positive for redness.  Respiratory: Negative.        Objective:   Physical Exam  Constitutional: He appears well-developed and well-nourished.  HENT:  Right Ear: External ear normal.  Left Ear: External ear normal.  Nose: Nose normal.  Mouth/Throat: Oropharynx is clear and moist.  Eyes: Conjunctivae are normal.  Neck: No thyromegaly present.  Pulmonary/Chest: Effort normal and breath sounds normal.  Lymphadenopathy:    He has no cervical adenopathy.          Assessment & Plan:  Recheck prn

## 2012-05-28 ENCOUNTER — Other Ambulatory Visit: Payer: BC Managed Care – PPO

## 2012-05-31 ENCOUNTER — Other Ambulatory Visit (INDEPENDENT_AMBULATORY_CARE_PROVIDER_SITE_OTHER): Payer: BC Managed Care – PPO

## 2012-05-31 DIAGNOSIS — Z0000 Encounter for general adult medical examination without abnormal findings: Secondary | ICD-10-CM

## 2012-05-31 LAB — CBC WITH DIFFERENTIAL/PLATELET
Basophils Relative: 0.2 % (ref 0.0–3.0)
Eosinophils Relative: 4 % (ref 0.0–5.0)
Hemoglobin: 15.6 g/dL (ref 13.0–17.0)
Lymphocytes Relative: 32.6 % (ref 12.0–46.0)
MCHC: 34.4 g/dL (ref 30.0–36.0)
MCV: 89.9 fl (ref 78.0–100.0)
Monocytes Absolute: 0.3 10*3/uL (ref 0.1–1.0)
Neutro Abs: 3.7 10*3/uL (ref 1.4–7.7)
Neutrophils Relative %: 57.9 % (ref 43.0–77.0)
RBC: 5.03 Mil/uL (ref 4.22–5.81)
WBC: 6.5 10*3/uL (ref 4.5–10.5)

## 2012-05-31 LAB — LIPID PANEL
Total CHOL/HDL Ratio: 4
VLDL: 19.8 mg/dL (ref 0.0–40.0)

## 2012-05-31 LAB — BASIC METABOLIC PANEL
CO2: 30 mEq/L (ref 19–32)
Chloride: 100 mEq/L (ref 96–112)
Creatinine, Ser: 1 mg/dL (ref 0.4–1.5)
Sodium: 138 mEq/L (ref 135–145)

## 2012-05-31 LAB — POCT URINALYSIS DIPSTICK
Ketones, UA: NEGATIVE
Protein, UA: NEGATIVE
Spec Grav, UA: <=1.005
Urobilinogen, UA: 0.2
pH, UA: 5.5

## 2012-06-01 LAB — HEPATIC FUNCTION PANEL
ALT: 24 U/L (ref 0–53)
Albumin: 4.2 g/dL (ref 3.5–5.2)
Alkaline Phosphatase: 75 U/L (ref 39–117)
Bilirubin, Direct: 0.2 mg/dL (ref 0.0–0.3)
Total Protein: 7.5 g/dL (ref 6.0–8.3)

## 2012-06-01 LAB — LDL CHOLESTEROL, DIRECT: Direct LDL: 156.9 mg/dL

## 2012-06-16 ENCOUNTER — Ambulatory Visit (INDEPENDENT_AMBULATORY_CARE_PROVIDER_SITE_OTHER): Payer: BC Managed Care – PPO | Admitting: Family Medicine

## 2012-06-16 ENCOUNTER — Encounter: Payer: Self-pay | Admitting: Family Medicine

## 2012-06-16 VITALS — BP 116/76 | HR 74 | Temp 98.8°F | Wt 172.0 lb

## 2012-06-16 DIAGNOSIS — S161XXA Strain of muscle, fascia and tendon at neck level, initial encounter: Secondary | ICD-10-CM

## 2012-06-16 DIAGNOSIS — S139XXA Sprain of joints and ligaments of unspecified parts of neck, initial encounter: Secondary | ICD-10-CM

## 2012-06-16 DIAGNOSIS — S0093XA Contusion of unspecified part of head, initial encounter: Secondary | ICD-10-CM

## 2012-06-16 DIAGNOSIS — S0003XA Contusion of scalp, initial encounter: Secondary | ICD-10-CM
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
## 2012-06-27 ENCOUNTER — Other Ambulatory Visit: Payer: Self-pay | Admitting: Family Medicine

## 2012-09-02 ENCOUNTER — Ambulatory Visit (INDEPENDENT_AMBULATORY_CARE_PROVIDER_SITE_OTHER): Payer: BC Managed Care – PPO | Admitting: Family Medicine

## 2012-09-02 ENCOUNTER — Encounter: Payer: BC Managed Care – PPO | Admitting: Family Medicine

## 2012-09-02 VITALS — BP 112/80 | Temp 98.5°F | Wt 172.0 lb

## 2012-09-02 DIAGNOSIS — J069 Acute upper respiratory infection, unspecified: Secondary | ICD-10-CM

## 2012-09-02 NOTE — Progress Notes (Signed)
Chief Complaint  Patient presents with  . Sore Throat    fever, body aches, chills, sinus pain and pressure     HPI:  Acute visit for sinus issues: -started: yesterday -symptoms:nasal congestion, sore throat, cough, sinus pressure and fullness, sore throat, temp 101 last night, no fever today, body aches -denies:fever, SOB, NVD, tooth pain, strep or mono exposure -has tried: nothing -sick contacts: both kids have been sick with similar symptoms -Hx of: sinus infection   ROS: See pertinent positives and negatives per HPI.  Past Medical History  Diagnosis Date  . Allergy     No family history on file.  History   Social History  . Marital Status: Divorced    Spouse Name: N/A    Number of Children: N/A  . Years of Education: N/A   Social History Main Topics  . Smoking status: Never Smoker   . Smokeless tobacco: Never Used  . Alcohol Use: 1.0 oz/week    2 drink(s) per week  . Drug Use: No  . Sexually Active: Not on file   Other Topics Concern  . Not on file   Social History Narrative  . No narrative on file    Current outpatient prescriptions:fluticasone (FLONASE) 50 MCG/ACT nasal spray, USE 2 SPRAYS IN EACH NOSTRIL DAILY, Disp: 16 g, Rfl: 6;  SINGULAIR 10 MG tablet, TAKE ONE TABLET BY MOUTH DAILY, Disp: 30 tablet, Rfl: 3;  WAL-FEX D ALLERGY & CONGESTION 180-240 MG per 24 hr tablet, TAKE 1 TABLET BY MOUTH EVERY DAY, Disp: 30 tablet, Rfl: 11;  minocycline (MINOCIN,DYNACIN) 100 MG capsule, , Disp: , Rfl:   EXAM:  Filed Vitals:   09/02/12 1605  BP: 112/80  Temp: 98.5 F (36.9 C)    Body mass index is 25.39 kg/(m^2).  GENERAL: vitals reviewed and listed above, alert, oriented, appears well hydrated and in no acute distress  HEENT: atraumatic, conjunttiva clear, no obvious abnormalities on inspection of external nose and ears, normal appearance of ear canals and TMs, clear nasal congestion, mild post oropharyngeal erythema with PND, no tonsillar edema or exudate,  no sinus TTP  NECK: no obvious masses on inspection  LUNGS: clear to auscultation bilaterally, no wheezes, rales or rhonchi, good air movement  CV: HRRR, no peripheral edema  MS: moves all extremities without noticeable abnormality  PSYCH: pleasant and cooperative, no obvious depression or anxiety  ASSESSMENT AND PLAN:  Discussed the following assessment and plan:  Viral upper respiratory infection  -afebrile today, hx and exam c/w with likely viral URI. Advised supportive care and return precautions. -Patient advised to return or notify a doctor immediately if symptoms worsen or persist or new concerns arise.  Patient Instructions  INSTRUCTIONS FOR UPPER RESPIRATORY INFECTION:  -plenty of rest and fluids  -nasal saline wash 2-3 times daily (use prepackaged nasal saline or bottled/distilled water if making your own)   -can use sinex or afrin nasal spray for drainage and nasal congestion - but do NOT use longer then 3-4 days  -can use tylenol (500-1000mg  up to 3 times dialy) or ibuprofen (400mg  every 8 hours) as directed for aches and sorethroat  -in the winter time, using a humidifier at night is helpful (please follow cleaning instructions)  -if you are taking a cough medication - use only as directed, may also try a teaspoon of honey to coat the throat and throat lozenges  -for sore throat, salt water gargles can help  -follow up if you have fevers, facial pain, tooth pain, difficulty  breathing or are worsening or not getting better in 5-7 days      KIM, [facility]

## 2012-09-02 NOTE — Progress Notes (Signed)
Canceled last minute   This encounter was created in error - please disregard. 

## 2012-09-02 NOTE — Patient Instructions (Addendum)
INSTRUCTIONS FOR UPPER RESPIRATORY INFECTION:  -plenty of rest and fluids  -nasal saline wash 2-3 times daily (use prepackaged nasal saline or bottled/distilled water if making your own)   -can use sinex or afrin nasal spray for drainage and nasal congestion - but do NOT use longer then 3-4 days  -can use tylenol (500-1000mg  up to 3 times dialy) or ibuprofen (400mg  every 8 hours) as directed for aches and sorethroat  -in the winter time, using a humidifier at night is helpful (please follow cleaning instructions)  -if you are taking a cough medication - use only as directed, may also try a teaspoon of honey to coat the throat and throat lozenges  -for sore throat, salt water gargles can help  -follow up if you have fevers, facial pain, tooth pain, difficulty breathing or are worsening or not getting better in 5-7 days

## 2012-09-06 ENCOUNTER — Encounter: Payer: Self-pay | Admitting: Family Medicine

## 2012-09-06 ENCOUNTER — Ambulatory Visit (INDEPENDENT_AMBULATORY_CARE_PROVIDER_SITE_OTHER): Payer: BC Managed Care – PPO | Admitting: Family Medicine

## 2012-09-06 VITALS — BP 118/80 | HR 74 | Temp 97.9°F | Wt 168.0 lb

## 2012-09-06 DIAGNOSIS — J019 Acute sinusitis, unspecified: Secondary | ICD-10-CM

## 2012-09-06 MED ORDER — AZITHROMYCIN 250 MG PO TABS: ORAL_TABLET | ORAL | Status: DC

## 2012-09-06 MED ORDER — METHYLPREDNISOLONE ACETATE 80 MG/ML IJ SUSP
120.0000 mg | Freq: Once | INTRAMUSCULAR | Status: AC
  Administered 2012-09-06: 120 mg via INTRAMUSCULAR

## 2012-09-06 NOTE — Addendum Note (Signed)
Addended by: Aniceto Boss A on: 09/06/2012 12:58 PM   Modules accepted: Orders

## 2012-09-06 NOTE — Progress Notes (Signed)
  Subjective:    Patient ID: Brent Bennett, male    DOB: 05-Mar-1966, 47 y.o.   MRN: [id number]  HPI Here with 48 hours of typical sinus symptoms including HA, sinus congestion, PND, and a low fever. He was seen last week for a different set of symptoms like body aches and diarrhea, consistent with a viral syndrome. Drinking fluids and taking Tylenol.    Review of Systems  Constitutional: Negative.   HENT: Positive for congestion and postnasal drip.   Eyes: Negative.   Respiratory: Negative.   Gastrointestinal: Negative.        Objective:   Physical Exam  Constitutional: He appears well-developed and well-nourished.  HENT:  Right Ear: External ear normal.  Left Ear: External ear normal.  Nose: Nose normal.  Mouth/Throat: Oropharynx is clear and moist.  Eyes: Conjunctivae are normal.  Pulmonary/Chest: Effort normal and breath sounds normal.  Lymphadenopathy:    He has no cervical adenopathy.          Assessment & Plan:  Sinusitis on top of a resolving viral syndrome. Out of work today.

## 2012-10-04 ENCOUNTER — Other Ambulatory Visit: Payer: Self-pay | Admitting: Family Medicine

## 2012-11-12 ENCOUNTER — Telehealth: Payer: Self-pay | Admitting: Family Medicine

## 2012-11-12 MED ORDER — AZITHROMYCIN 250 MG PO TABS: ORAL_TABLET | ORAL | Status: DC

## 2012-11-12 NOTE — Telephone Encounter (Signed)
He has a sinus infection, we will send in a Zpack

## 2012-11-24 ENCOUNTER — Ambulatory Visit (INDEPENDENT_AMBULATORY_CARE_PROVIDER_SITE_OTHER): Payer: BC Managed Care – PPO | Admitting: Family Medicine

## 2012-11-24 ENCOUNTER — Encounter: Payer: Self-pay | Admitting: Family Medicine

## 2012-11-24 VITALS — BP 120/84 | Temp 98.3°F | Wt 170.0 lb

## 2012-11-24 DIAGNOSIS — J329 Chronic sinusitis, unspecified: Secondary | ICD-10-CM

## 2012-11-24 MED ORDER — METHYLPREDNISOLONE ACETATE 80 MG/ML IJ SUSP
120.0000 mg | Freq: Once | INTRAMUSCULAR | Status: AC
  Administered 2012-11-24: 120 mg via INTRAMUSCULAR

## 2012-11-24 MED ORDER — LEVOFLOXACIN 500 MG PO TABS: 500.0000 mg | ORAL_TABLET | Freq: Every day | ORAL | Status: AC

## 2012-11-24 NOTE — Progress Notes (Signed)
  Subjective:    Patient ID: Brent Bennett, male    DOB: Aug 03, 1965, 47 y.o.   MRN: [id number]  HPI Here for continued sinus symptoms despite taking a Zpack recently. He still has sinus pressure, HAs, PND, and ST. No cough.    Review of Systems  Constitutional: Negative.   HENT: Positive for congestion, postnasal drip and sinus pressure.   Eyes: Negative.   Respiratory: Negative.        Objective:   Physical Exam  Constitutional: He appears well-developed and well-nourished.  HENT:  Right Ear: External ear normal.  Left Ear: External ear normal.  Nose: Nose normal.  Mouth/Throat: Oropharynx is clear and moist.  Eyes: Conjunctivae are normal.  Pulmonary/Chest: Effort normal and breath sounds normal.  Lymphadenopathy:    He has no cervical adenopathy.          Assessment & Plan:  Add Mucinex D.

## 2013-02-09 ENCOUNTER — Other Ambulatory Visit: Payer: Self-pay | Admitting: Family Medicine

## 2013-03-29 ENCOUNTER — Telehealth: Payer: Self-pay | Admitting: Family Medicine

## 2013-03-29 NOTE — Telephone Encounter (Signed)
Pt's daughter has tested pos for the flu. Pt advised to get tamiflu from pcp.pls advise [facility]/ [address]

## 2013-03-30 MED ORDER — OSELTAMIVIR PHOSPHATE 75 MG PO CAPS: 75.0000 mg | ORAL_CAPSULE | Freq: Two times a day (BID) | ORAL | Status: DC

## 2013-03-30 NOTE — Telephone Encounter (Signed)
done

## 2013-04-20 ENCOUNTER — Ambulatory Visit (INDEPENDENT_AMBULATORY_CARE_PROVIDER_SITE_OTHER): Payer: BC Managed Care – PPO | Admitting: Family Medicine

## 2013-04-20 ENCOUNTER — Encounter: Payer: Self-pay | Admitting: Family Medicine

## 2013-04-20 VITALS — BP 128/78 | HR 93 | Temp 98.4°F | Wt 174.0 lb

## 2013-04-20 DIAGNOSIS — J019 Acute sinusitis, unspecified: Secondary | ICD-10-CM

## 2013-04-20 MED ORDER — LEVOFLOXACIN 500 MG PO TABS: 500.0000 mg | ORAL_TABLET | Freq: Every day | ORAL | Status: AC

## 2013-04-20 NOTE — Progress Notes (Signed)
Pre visit review using our clinic review tool, if applicable. No additional management support is needed unless otherwise documented below in the visit note. 

## 2013-04-21 ENCOUNTER — Encounter: Payer: Self-pay | Admitting: Family Medicine

## 2013-04-21 DIAGNOSIS — J019 Acute sinusitis, unspecified: Secondary | ICD-10-CM

## 2013-04-21 MED ORDER — METHYLPREDNISOLONE ACETATE 80 MG/ML IJ SUSP
120.0000 mg | Freq: Once | INTRAMUSCULAR | Status: AC
  Administered 2013-04-21: 120 mg via INTRAMUSCULAR

## 2013-04-21 NOTE — Addendum Note (Signed)
Addended by: Aniceto BossNIMMONS, Talin Rozeboom A on: 04/21/2013 08:24 AM   Modules accepted: Orders

## 2013-04-21 NOTE — Progress Notes (Signed)
   Subjective:    Patient ID: Judith BlonderMichael A Baise, male    DOB: [birth year], 48 y.o.   MRN: [id number]  HPI Here for one week of sinus pressure , PND, HA, and coughing up green sputum.    Review of Systems  Constitutional: Negative.   HENT: Positive for congestion, postnasal drip and sinus pressure.   Eyes: Negative.   Respiratory: Positive for cough.        Objective:   Physical Exam  Constitutional: He appears well-developed and well-nourished.  HENT:  Right Ear: External ear normal.  Left Ear: External ear normal.  Nose: Nose normal.  Mouth/Throat: Oropharynx is clear and moist.  Eyes: Conjunctivae are normal.  Pulmonary/Chest: Effort normal and breath sounds normal.  Lymphadenopathy:    He has no cervical adenopathy.          Assessment & Plan:  Given a steroid shot and Levaquin. Add Mucinex D

## 2013-05-19 ENCOUNTER — Telehealth: Payer: Self-pay | Admitting: Family Medicine

## 2013-05-19 MED ORDER — PROMETHAZINE HCL 25 MG PO TABS: 25.0000 mg | ORAL_TABLET | ORAL | Status: DC | PRN

## 2013-05-19 NOTE — Telephone Encounter (Signed)
He has been vomiting since last night. Trying to sip liquids. No fever. We will send in Phenergan for him to try. He will come in if this does not work.

## 2013-05-22 ENCOUNTER — Other Ambulatory Visit: Payer: Self-pay | Admitting: Family Medicine

## 2013-06-13 ENCOUNTER — Other Ambulatory Visit: Payer: Self-pay | Admitting: Family Medicine

## 2013-06-16 ENCOUNTER — Other Ambulatory Visit: Payer: Self-pay | Admitting: Allergy

## 2013-06-16 ENCOUNTER — Ambulatory Visit
Admission: RE | Admit: 2013-06-16 | Discharge: 2013-06-16 | Disposition: A | Discharge status: Home / Self Care | Payer: BC Managed Care – PPO | Source: Ambulatory Visit | Attending: Allergy | Admitting: Allergy

## 2013-06-16 DIAGNOSIS — J329 Chronic sinusitis, unspecified: Secondary | ICD-10-CM

## 2013-09-04 ENCOUNTER — Other Ambulatory Visit: Payer: Self-pay | Admitting: Family Medicine

## 2013-10-19 ENCOUNTER — Other Ambulatory Visit: Payer: Self-pay | Admitting: Family Medicine

## 2014-01-02 ENCOUNTER — Other Ambulatory Visit: Payer: Self-pay | Admitting: Family Medicine

## 2014-01-04 ENCOUNTER — Other Ambulatory Visit: Payer: Self-pay | Admitting: Dermatology

## 2014-02-01 ENCOUNTER — Other Ambulatory Visit: Payer: Self-pay | Admitting: Family Medicine

## 2014-02-06 ENCOUNTER — Encounter: Payer: Self-pay | Admitting: Family Medicine

## 2014-02-06 ENCOUNTER — Ambulatory Visit (INDEPENDENT_AMBULATORY_CARE_PROVIDER_SITE_OTHER): Payer: BC Managed Care – PPO | Admitting: Family Medicine

## 2014-02-06 VITALS — BP 110/75 | HR 62 | Temp 98.3°F | Ht 69.0 in | Wt 174.0 lb

## 2014-02-06 DIAGNOSIS — J0100 Acute maxillary sinusitis, unspecified: Secondary | ICD-10-CM

## 2014-02-06 MED ORDER — AZITHROMYCIN 250 MG PO TABS: ORAL_TABLET | ORAL | Status: DC

## 2014-02-06 MED ORDER — METHYLPREDNISOLONE ACETATE 40 MG/ML IJ SUSP
120.0000 mg | Freq: Once | INTRAMUSCULAR | Status: AC
  Administered 2014-02-06: 120 mg via INTRAMUSCULAR

## 2014-02-06 NOTE — Progress Notes (Signed)
Pre visit review using our clinic review tool, if applicable. No additional management support is needed unless otherwise documented below in the visit note. 

## 2014-02-08 ENCOUNTER — Encounter: Payer: Self-pay | Admitting: Family Medicine

## 2014-02-08 NOTE — Progress Notes (Signed)
   Subjective:    Patient ID: Brent Bennett, male    DOB: 1965-09-05, 48 y.o.   MRN: [id number]  HPI Here for one week of sinus pressure, PND, ST ,and a dry cough. No fever.    Review of Systems  Constitutional: Negative.   HENT: Positive for congestion, postnasal drip and sinus pressure.   Eyes: Negative.   Respiratory: Positive for cough.        Objective:   Physical Exam  Constitutional: He appears well-developed and well-nourished.  HENT:  Right Ear: External ear normal.  Left Ear: External ear normal.  Nose: Nose normal.  Mouth/Throat: Oropharynx is clear and moist.  Eyes: Conjunctivae are normal.  Pulmonary/Chest: Effort normal and breath sounds normal.  Lymphadenopathy:    He has no cervical adenopathy.          Assessment & Plan:  Add Mucinex D

## 2014-02-28 ENCOUNTER — Other Ambulatory Visit: Payer: Self-pay | Admitting: Family Medicine

## 2014-05-29 ENCOUNTER — Telehealth: Payer: Self-pay | Admitting: Family Medicine

## 2014-05-29 MED ORDER — ALBENDAZOLE 200 MG PO TABS: ORAL_TABLET | ORAL | Status: DC

## 2014-05-29 NOTE — Telephone Encounter (Signed)
His daughter has probable pinworms

## 2014-05-30 ENCOUNTER — Telehealth: Payer: Self-pay | Admitting: Family Medicine

## 2014-05-30 MED ORDER — AZITHROMYCIN 250 MG PO TABS: ORAL_TABLET | ORAL | Status: DC

## 2014-05-30 NOTE — Telephone Encounter (Signed)
He has sx of another sinusitis

## 2014-06-29 ENCOUNTER — Telehealth: Payer: Self-pay | Admitting: Family Medicine

## 2014-06-29 MED ORDER — AZITHROMYCIN 250 MG PO TABS: ORAL_TABLET | ORAL | Status: DC

## 2014-06-29 NOTE — Telephone Encounter (Signed)
He has a sinus infection.  

## 2014-07-01 ENCOUNTER — Encounter: Payer: Self-pay | Admitting: Family Medicine

## 2014-07-01 ENCOUNTER — Ambulatory Visit (INDEPENDENT_AMBULATORY_CARE_PROVIDER_SITE_OTHER): Payer: BLUE CROSS/BLUE SHIELD | Admitting: Family Medicine

## 2014-07-01 VITALS — BP 132/74 | HR 87 | Temp 98.8°F | Wt 171.0 lb

## 2014-07-01 DIAGNOSIS — J069 Acute upper respiratory infection, unspecified: Secondary | ICD-10-CM | POA: Diagnosis not present

## 2014-07-01 DIAGNOSIS — J302 Other seasonal allergic rhinitis: Secondary | ICD-10-CM

## 2014-07-01 DIAGNOSIS — J4520 Mild intermittent asthma, uncomplicated: Secondary | ICD-10-CM | POA: Diagnosis not present

## 2014-07-01 MED ORDER — IPRATROPIUM-ALBUTEROL 0.5-2.5 (3) MG/3ML IN SOLN: 3.0000 mL | RESPIRATORY_TRACT | Status: DC | PRN

## 2014-07-01 MED ORDER — HYDROCOD POLST-CHLORPHEN POLST 10-8 MG/5ML PO LQCR: 5.0000 mL | Freq: Every evening | ORAL | Status: DC | PRN

## 2014-07-01 NOTE — Patient Instructions (Signed)
Nice to meet you. Please finish your zpack as directed.  Ok to use your inhaler and or nebulizer as directed.  Tussionex as needed for severe cough- this will make you sleepy.  Please do not drive or operate heavy machinery after taking it.

## 2014-07-01 NOTE — Progress Notes (Signed)
Subjective:   Patient ID: Brent Bennett, male    DOB: 06-23-1965, 49 y.o.   MRN: [id number]  Brent Bennett is a pleasant 49 y.o. year old male pt of Dr. Clent RidgesFry, new to me, who presents to clinic today with URI  on 07/01/2014  HPI:  H/o allergic rhinitis- takes Allegra D and flonase year round.  Also gets allergy shots.  Complains of "chest congestion," productive cough, post nasal drainage. Takes allegra and flonase.  Dr. Clent RidgesFry called in a zpack for him two days ago which he is currently taking.  He is here today because he feels his symptoms are worsening.  Does have remote h/o asthma.  Feels his chest is more tight today and cough is more productive and more "hacking."  No fevers.  49 year old daughter diagnosed with bronchitis last week.  Current Outpatient Prescriptions on File Prior to Visit  Medication Sig Dispense Refill  . albendazole (ALBENZA) 200 MG tablet Take 2 tablets today and repeat in 2 weeks 4 tablet 0  . ALLEGRA-D ALLERGY & CONGESTION 180-240 MG per 24 hr tablet TAKE 1 TABLET BY MOUTH EVERY DAY 30 tablet 3  . azithromycin (ZITHROMAX) 250 MG tablet As directed 6 tablet 0  . fexofenadine (ALLEGRA) 180 MG tablet Take 180 mg by mouth daily.    . fluticasone (FLONASE) 50 MCG/ACT nasal spray INSTILL 2 SPRAYS IN EACH NOSTRIL ONCE A DAY 16 g 3  . minocycline (MINOCIN,DYNACIN) 100 MG capsule Take 100 mg by mouth daily.     . promethazine (PHENERGAN) 25 MG tablet TAKE 1 TABLET BY MOUTH EVERY 4 HOURS AS NEEDED FOR NAUSEA/VOMITING 60 tablet 2  . SINGULAIR 10 MG tablet TAKE ONE TABLET BY MOUTH DAILY 30 tablet 3   No current facility-administered medications on file prior to visit.    Allergies  Allergen Reactions  . Penicillins     Past Medical History  Diagnosis Date  . Allergy     No past surgical history on file.  No family history on file.  History   Social History  . Marital Status: Divorced    Spouse Name: N/A  . Number of Children: N/A  . Years of  Education: N/A   Occupational History  . Not on file.   Social History Main Topics  . Smoking status: Never Smoker   . Smokeless tobacco: Never Used  . Alcohol Use: 1.2 oz/week    2 Standard drinks or equivalent per week  . Drug Use: No  . Sexual Activity: Not on file   Other Topics Concern  . Not on file   Social History Narrative   The PMH, PSH, Social History, Family History, Medications, and allergies [facility], and have been updated if relevant.   Review of Systems  Constitutional: Negative.   HENT: Positive for congestion, ear pain, postnasal drip, rhinorrhea and sore throat. Negative for ear discharge and sinus pressure.   Eyes: Negative.   Respiratory: Positive for cough and chest tightness. Negative for shortness of breath.   Cardiovascular: Negative.   Musculoskeletal: Negative.   Skin: Negative.   Allergic/Immunologic: Positive for environmental allergies.  All other systems reviewed and are negative.      Objective:    BP 132/74 mmHg  Pulse 87  Temp(Src) 98.8 F (37.1 C) (Oral)  Wt 171 lb (77.565 kg)  SpO2 98%   Physical Exam  Constitutional: He is oriented to person, place, and time. He appears well-developed and well-nourished. No  distress.  HENT:  Head: Normocephalic.  Right Ear: Hearing and tympanic membrane normal.  Left Ear: Hearing and tympanic membrane normal.  Nose: Mucosal edema and rhinorrhea present. No sinus tenderness.  Mouth/Throat: Posterior oropharyngeal erythema present. No oropharyngeal exudate or posterior oropharyngeal edema.  Eyes: Conjunctivae are normal.  Neck: Normal range of motion.  Cardiovascular: Normal rate and regular rhythm.   Pulmonary/Chest: Effort normal and breath sounds normal. No respiratory distress. He has no wheezes.  Lymphadenopathy:    He has no cervical adenopathy.  Neurological: He is alert and oriented to person, place, and time. No cranial nerve deficit.  Skin: Skin is warm and dry.    Psychiatric: He has a normal mood and affect. His behavior is normal. Judgment and thought content normal.  Nursing note and vitals reviewed.         Assessment & Plan:   Acute upper respiratory infection  Asthma, mild intermittent, uncomplicated  Other seasonal allergic rhinitis No Follow-up on file.

## 2014-07-01 NOTE — Progress Notes (Signed)
Pre visit review using our clinic review tool, if applicable. No additional management support is needed unless otherwise documented below in the visit note. 

## 2014-07-01 NOTE — Assessment & Plan Note (Signed)
New- persistent despite taking Zpack. Lung exam reassuring- no wheezing and good air movement. Duonebs sent in for him to take since he has a nebulizer at home and would prefer to use that instead of an inhaler. Tussionex rx prn bed time for severe cough- discussed sedation precautions.  Explained to Mr. Brent Bennett that he needs to give the zpack more time to work if this is bacterial but more likely this is a viral infection that needs to run its course which is why abx likely not helping.  Ok to finish zpack.  Call or return to clinic prn if these symptoms worsen or fail to improve as anticipated. The patient indicates understanding of these issues and agrees with the plan.

## 2014-07-03 ENCOUNTER — Ambulatory Visit (INDEPENDENT_AMBULATORY_CARE_PROVIDER_SITE_OTHER): Payer: BLUE CROSS/BLUE SHIELD | Admitting: Family Medicine

## 2014-07-03 ENCOUNTER — Telehealth: Payer: Self-pay

## 2014-07-03 ENCOUNTER — Encounter: Payer: Self-pay | Admitting: Family Medicine

## 2014-07-03 VITALS — BP 102/80 | HR 96 | Temp 98.4°F | Ht 68.0 in | Wt 171.6 lb

## 2014-07-03 DIAGNOSIS — J069 Acute upper respiratory infection, unspecified: Secondary | ICD-10-CM

## 2014-07-03 NOTE — Progress Notes (Signed)
Pre visit review using our clinic review tool, if applicable. No additional management support is needed unless otherwise documented below in the visit note. 

## 2014-07-03 NOTE — Telephone Encounter (Signed)
PLEASE NOTE: All timestamps contained within this report are represented as [place] Standard Time. CONFIDENTIALTY NOTICE: This fax transmission is intended only for the addressee. It contains information that is legally privileged, confidential or otherwise protected from use or disclosure. If you are not the intended recipient, you are strictly prohibited from reviewing, disclosing, copying using or disseminating any of this information or taking any action in reliance on or regarding this information. If you have received this fax in error, please notify us immediately by telephone so that we can arrange for its return to [place]. Phone: [phone], Toll-Free: [phone], Fax: [phone] Page: 1 of 2 Call Id: [id number] [facility] Night - Client TELEPHONE ADVICE RECORD [facility] Patient Name: Brent Bennett Gender: Male DOB: 1966-03-30 Age: 49 Y 8 M 5 D Return Phone Number: [phone] (Primary) Address: [address] City/State/Zip: [place] [address] Client [facility] Night - Client Client Site [facility] - Night Physician Gershon Crane Contact Type Call Call Type Triage / Clinical Relationship To Patient Self Return Phone Number [phone] (Primary) Chief Complaint Nasal Congestion Initial Comment Caller states he has a sinus infection that has moved into his chest. He is very congested. PreDisposition Did not know what to do Nurse Assessment Nurse: Enid Skeens, RN, Arline Asp Date/Time (Eastern Time): 07/01/2014 8:33:21 AM Confirm and document reason for call. If symptomatic, describe symptoms. ---Caller states he has a sinus infection that has moved into his chest. He is very congested. Has the patient traveled out of the country within the last 30 days? ---No Does the patient require triage? ---Yes Related visit to physician within the last 2 weeks? ---No Does the PT have any chronic conditions?  (i.e. diabetes, asthma, etc.) ---No Guidelines Guideline Title Affirmed Question Affirmed Notes Nurse Date/Time (Eastern Time) Nasal Allergies (Hay Fever) [1] Taking antihistamines > 2 days AND [2] nasal allergy symptoms interfere with sleep, school, or work Oceanographer, Charity fundraiser, [facility] 07/01/2014 8:35:07 AM Disp. Time Lamount Cohen Time) Disposition Final User 07/01/2014 8:39:20 AM See PCP When Office is Open (within 3 days) Yes Enid Skeens, RN, Ave Filter Understands: Yes Disagree/Comply: Comply Care Advice Given Per Guideline PLEASE NOTE: All timestamps contained within this report are represented as [place] Standard Time. CONFIDENTIALTY NOTICE: This fax transmission is intended only for the addressee. It contains information that is legally privileged, confidential or otherwise protected from use or disclosure. If you are not the intended recipient, you are strictly prohibited from reviewing, disclosing, copying using or disseminating any of this information or taking any action in reliance on or regarding this information. If you have received this fax in error, please notify us immediately by telephone so that we can arrange for its return to [place]. Phone: [phone], Toll-Free: [phone], Fax: [phone] Page: 2 of 2 Call Id: [id number] Care Advice Given Per Guideline SEE PCP WITHIN 3 DAYS: You need to be examined within 2 or 3 days. Call your doctor during regular office hours and make an appointment. (Note: if office will be open tomorrow, tell caller to call then, not in 3 days). WASH POLLEN OFF BODY DAILY: Remove pollen from the body with hair washing and a shower, especially before bedtime. ANTIHISTAMINE MEDICATIONS FOR HAY FEVER: * Antihistamine medications help reduce sneezing, itching and runny nose. They are the drug of choice for treating symptoms of hay fever. AVOIDING POLLEN: * Stay indoors on windy days * Keep windows closed in home, at least in bedroom; use air conditioner. *  Use a  high efficiency house air filter (HEPA or electrostatic) * Do not use window fans * Keep windows closed in car, turn AC on recirculate * Avoid playing with outdoor dog CALL BACK IF: * Symptoms aren't controlled in 2 days with continuous antihistamines. * You become worse. CARE ADVICE given per Nasal Allergies (Hay Fever) (Adult) guideline. After Care Instructions Given Call Event Type User Date / Time Description Referrals [facility]

## 2014-07-03 NOTE — Progress Notes (Signed)
HPI:  -started: last week -symptoms:nasal congestion, sore throat, cough, sinus congestion, body aches initially -he has been treated with abx by PCP and duonebs - he reports he is doing better and is not having SOB, wheezing - he has cough medications -denies: fever, SOB, NVD, tooth pain -has tried: duonebs and abx and is doing better -sick contacts/travel/risks: denies flu exposure, tick exposure or or Ebola risks - kids are always sick with something, wife has URI -Hx of: allergies  ROS: See pertinent positives and negatives per HPI.  Past Medical History  Diagnosis Date  . Allergy     No past surgical history on file.  No family history on file.  History   Social History  . Marital Status: Divorced    Spouse Name: N/A  . Number of Children: N/A  . Years of Education: N/A   Social History Main Topics  . Smoking status: Never Smoker   . Smokeless tobacco: Never Used  . Alcohol Use: 1.2 oz/week    2 Standard drinks or equivalent per week  . Drug Use: No  . Sexual Activity: Not on file   Other Topics Concern  . None   Social History Narrative     Current outpatient prescriptions:  .  ALLEGRA-D ALLERGY & CONGESTION 180-240 MG per 24 hr tablet, TAKE 1 TABLET BY MOUTH EVERY DAY, Disp: 30 tablet, Rfl: 3 .  chlorpheniramine-HYDROcodone (TUSSIONEX PENNKINETIC ER) 10-8 MG/5ML LQCR, Take 5 mLs by mouth at bedtime as needed., Disp: 140 mL, Rfl: 0 .  fexofenadine (ALLEGRA) 180 MG tablet, Take 180 mg by mouth daily., Disp: , Rfl:  .  fluticasone (FLONASE) 50 MCG/ACT nasal spray, INSTILL 2 SPRAYS IN EACH NOSTRIL ONCE A DAY, Disp: 16 g, Rfl: 3 .  ipratropium-albuterol (DUONEB) 0.5-2.5 (3) MG/3ML SOLN, Take 3 mLs by nebulization every 4 (four) hours as needed., Disp: 360 mL, Rfl: 0 .  minocycline (MINOCIN,DYNACIN) 100 MG capsule, Take 100 mg by mouth daily. , Disp: , Rfl:  .  SINGULAIR 10 MG tablet, TAKE ONE TABLET BY MOUTH DAILY, Disp: 30 tablet, Rfl: 3  EXAM:  Filed  Vitals:   07/03/14 1419  BP: 102/80  Pulse: 96  Temp: 98.4 F (36.9 C)    Body mass index is 26.1 kg/(m^2).  GENERAL: vitals reviewed and listed above, alert, oriented, appears well hydrated and in no acute distress  HEENT: atraumatic, conjunttiva clear, no obvious abnormalities on inspection of external nose and ears, normal appearance of ear canals and TMs, clear nasal congestion, mild post oropharyngeal erythema with PND, no tonsillar edema or exudate, no sinus TTP  NECK: no obvious masses on inspection  LUNGS: clear to auscultation bilaterally, no wheezes, rales or rhonchi, good air movement  CV: HRRR, no peripheral edema  MS: moves all extremities without noticeable abnormality  PSYCH: pleasant and cooperative, no obvious depression or anxiety  ASSESSMENT AND PLAN:  Discussed the following assessment and plan:  Viral upper respiratory illness  -given HPI and exam findings today, a serious infection or illness is unlikely. We discussed potential etiologies, with VURI being most likely, and advised supportive care and monitoring. We discussed treatment side effects, likely course, antibiotic misuse, transmission, and signs of developing a serious illness. -doing better overall per his report and denies any asthma symptoms and reports cough is improving -advised continued supportive care and follow up as needed -he is interested in tamiflu - doubt flu as he denies having any fever, more likely flu-like illness - advised given  has been > 4 days since onset of symptoms, improving and no serious symptoms today benefit of tamiflu does not outweigh risks, he agreed after discussion -discussed prednisone for cough but he opted to hold of on this since improving -of course, we advised to return or notify a doctor immediately if symptoms worsen or persist or new concerns arise.    There are no Patient Instructions on file for this visit.   Kriste Basque R.

## 2014-08-07 ENCOUNTER — Other Ambulatory Visit: Payer: Self-pay | Admitting: Family Medicine

## 2014-08-21 DIAGNOSIS — M25572 Pain in left ankle and joints of left foot: Secondary | ICD-10-CM | POA: Insufficient documentation

## 2014-09-05 DIAGNOSIS — S93432A Sprain of tibiofibular ligament of left ankle, initial encounter: Secondary | ICD-10-CM | POA: Insufficient documentation

## 2014-09-15 ENCOUNTER — Other Ambulatory Visit: Payer: Self-pay | Admitting: Family Medicine

## 2014-10-03 ENCOUNTER — Other Ambulatory Visit: Payer: Self-pay | Admitting: Family Medicine

## 2015-04-30 ENCOUNTER — Telehealth: Payer: Self-pay | Admitting: Family Medicine

## 2015-04-30 MED ORDER — CEPHALEXIN 500 MG PO CAPS: 500.0000 mg | ORAL_CAPSULE | Freq: Three times a day (TID) | ORAL | Status: AC

## 2015-04-30 NOTE — Telephone Encounter (Signed)
His 50 yr old daughter was diagnosed with a strep throat and he asks if he can get on something preventative. Sent in a week of Keflex.

## 2015-05-14 ENCOUNTER — Ambulatory Visit
Admission: RE | Admit: 2015-05-14 | Discharge: 2015-05-14 | Disposition: A | Discharge status: Home / Self Care | Payer: BLUE CROSS/BLUE SHIELD | Source: Ambulatory Visit | Attending: Family Medicine | Admitting: Family Medicine

## 2015-05-14 ENCOUNTER — Ambulatory Visit (INDEPENDENT_AMBULATORY_CARE_PROVIDER_SITE_OTHER): Payer: BLUE CROSS/BLUE SHIELD | Admitting: Family Medicine

## 2015-05-14 ENCOUNTER — Encounter: Payer: Self-pay | Admitting: Family Medicine

## 2015-05-14 VITALS — BP 108/70 | HR 72 | Temp 98.9°F | Ht 68.0 in | Wt 180.0 lb

## 2015-05-14 DIAGNOSIS — R1030 Lower abdominal pain, unspecified: Secondary | ICD-10-CM

## 2015-05-14 LAB — HEPATIC FUNCTION PANEL
ALBUMIN: 4.4 g/dL (ref 3.5–5.2)
ALT: 17 U/L (ref 0–53)
AST: 20 U/L (ref 0–37)
Alkaline Phosphatase: 73 U/L (ref 39–117)
BILIRUBIN DIRECT: 0.2 mg/dL (ref 0.0–0.3)
TOTAL PROTEIN: 7.6 g/dL (ref 6.0–8.3)
Total Bilirubin: 2.2 mg/dL — ABNORMAL HIGH (ref 0.2–1.2)

## 2015-05-14 LAB — POCT URINALYSIS DIPSTICK
BILIRUBIN UA: NEGATIVE
Blood, UA: NEGATIVE
GLUCOSE UA: NEGATIVE
Ketones, UA: NEGATIVE
Leukocytes, UA: NEGATIVE
NITRITE UA: NEGATIVE
Protein, UA: NEGATIVE
SPEC GRAV UA: 1.025
UROBILINOGEN UA: 0.2
pH, UA: 6

## 2015-05-14 LAB — BASIC METABOLIC PANEL
BUN: 14 mg/dL (ref 6–23)
CALCIUM: 9.7 mg/dL (ref 8.4–10.5)
CO2: 31 mEq/L (ref 19–32)
Chloride: 101 mEq/L (ref 96–112)
Creatinine, Ser: 1.04 mg/dL (ref 0.40–1.50)
GFR: 80.49 mL/min (ref >60.00)
GLUCOSE: 111 mg/dL — AB (ref 70–99)
POTASSIUM: 4.1 meq/L (ref 3.5–5.1)
Sodium: 139 mEq/L (ref 135–145)

## 2015-05-14 LAB — CBC WITH DIFFERENTIAL/PLATELET
Basophils Absolute: 0.1 10*3/uL (ref 0.0–0.1)
Basophils Relative: 0.4 % (ref 0.0–3.0)
EOS PCT: 1.2 % (ref 0.0–5.0)
Eosinophils Absolute: 0.1 10*3/uL (ref 0.0–0.7)
HCT: 47.2 % (ref 39.0–52.0)
Hemoglobin: 15.9 g/dL (ref 13.0–17.0)
Lymphocytes Relative: 15.6 % (ref 12.0–46.0)
Lymphs Abs: 1.9 10*3/uL (ref 0.7–4.0)
MCHC: 33.7 g/dL (ref 30.0–36.0)
MCV: 89.6 fl (ref 78.0–100.0)
Monocytes Absolute: 1 10*3/uL (ref 0.1–1.0)
Monocytes Relative: 7.9 % (ref 3.0–12.0)
Neutro Abs: 9.2 10*3/uL — ABNORMAL HIGH (ref 1.4–7.7)
Neutrophils Relative %: 74.9 % (ref 43.0–77.0)
Platelets: 229 10*3/uL (ref 150.0–400.0)
RBC: 5.27 Mil/uL (ref 4.22–5.81)
RDW: 12.7 % (ref 11.5–15.5)
WBC: 12.3 10*3/uL — AB (ref 4.0–10.5)

## 2015-05-14 LAB — LIPASE: Lipase: 25 U/L (ref 11.0–59.0)

## 2015-05-14 MED ORDER — CIPROFLOXACIN HCL 500 MG PO TABS: 500.0000 mg | ORAL_TABLET | Freq: Two times a day (BID) | ORAL | Status: DC

## 2015-05-14 MED ORDER — METRONIDAZOLE 500 MG PO TABS: 500.0000 mg | ORAL_TABLET | Freq: Three times a day (TID) | ORAL | Status: DC

## 2015-05-14 MED ORDER — IOPAMIDOL (ISOVUE-300) INJECTION 61%
100.0000 mL | Freq: Once | INTRAVENOUS | Status: AC | PRN
  Administered 2015-05-14: 100 mL via INTRAVENOUS

## 2015-05-14 NOTE — Progress Notes (Signed)
Pre visit review using our clinic review tool, if applicable. No additional management support is needed unless otherwise documented below in the visit note. 

## 2015-05-14 NOTE — Addendum Note (Signed)
Addended by: Aniceto Boss A on: 05/14/2015 01:48 PM   Modules accepted: Orders

## 2015-05-14 NOTE — Addendum Note (Signed)
Addended by: Gershon Crane A on: 05/14/2015 01:05 PM   Modules accepted: Orders

## 2015-05-14 NOTE — Progress Notes (Signed)
   Subjective:    Patient ID: Brent Bennett, male    DOB: 06/14/1965, 50 y.o.   MRN: [id number]  HPI Here for the onset yesterday of fever to 101 degrees, lower abdominal pain, and diarrhea. No nausea or vomiting. Using Tagamet with no improvement. His appetite is decreased today. Drinking fluids.    Review of Systems  Constitutional: Positive for fever, chills and appetite change. Negative for diaphoresis.  HENT: Negative.   Eyes: Negative.   Respiratory: Negative.   Cardiovascular: Negative.   Gastrointestinal: Positive for abdominal pain, diarrhea and abdominal distention. Negative for nausea, vomiting, constipation, blood in stool, anal bleeding and rectal pain.  Genitourinary: Negative.   Neurological: Negative.        Objective:   Physical Exam  Constitutional: He is oriented to person, place, and time. He appears well-developed and well-nourished. No distress.  Eyes: No scleral icterus.  Neck: No thyromegaly present.  Cardiovascular: Normal rate, regular rhythm, normal heart sounds and intact distal pulses.   Pulmonary/Chest: Effort normal and breath sounds normal.  Abdominal: Bowel sounds are normal. He exhibits no mass. There is no rebound.  He has involuntary guarding and he is distended. He is moderately tender in both lower quadrants  Lymphadenopathy:    He has no cervical adenopathy.  Neurological: He is alert and oriented to person, place, and time.          Assessment & Plan:  Lower abdominal pain with fever and guarding. I am concerned about a possible appendicitis. We will get stat labs and set up a CT of the abdomen and pelvis this afternoon.

## 2015-05-14 NOTE — Addendum Note (Signed)
Addended by: Gershon Crane A on: 05/14/2015 04:36 PM   Modules accepted: Orders

## 2015-05-18 ENCOUNTER — Ambulatory Visit (INDEPENDENT_AMBULATORY_CARE_PROVIDER_SITE_OTHER): Payer: BLUE CROSS/BLUE SHIELD | Admitting: Family Medicine

## 2015-05-18 ENCOUNTER — Encounter: Payer: Self-pay | Admitting: Family Medicine

## 2015-05-18 VITALS — BP 113/76 | HR 62 | Temp 97.8°F | Ht 68.0 in | Wt 175.0 lb

## 2015-05-18 DIAGNOSIS — K5732 Diverticulitis of large intestine without perforation or abscess without bleeding: Secondary | ICD-10-CM | POA: Diagnosis not present

## 2015-05-18 NOTE — Progress Notes (Signed)
Pre visit review using our clinic review tool, if applicable. No additional management support is needed unless otherwise documented below in the visit note. 

## 2015-05-18 NOTE — Progress Notes (Signed)
   Subjective:    Patient ID: Brent Bennett, male    DOB: 03-23-1966, 50 y.o.   MRN: [id number]  HPI Here to follow up on sigmoid diverticulitis. He was seen here 4 days ago with lower abdominal pains, and a subsequent CT scan revealed the colonic inflammation. He has been taking Cipro and Flagyl since then , and he is feeling much better. The fever is gone, and the abdominal pain has resolved. He feels some mild bloating and he has loose stools, but I think these may be side effects from the antibiotics. His appetite is back to normal.    Review of Systems  Constitutional: Negative.   Respiratory: Negative.   Cardiovascular: Negative.   Gastrointestinal: Positive for diarrhea and abdominal distention. Negative for nausea, vomiting, abdominal pain, constipation, blood in stool, anal bleeding and rectal pain.       Objective:   Physical Exam  Constitutional: He appears well-developed and well-nourished. No distress.  Cardiovascular: Normal rate, regular rhythm, normal heart sounds and intact distal pulses.   Pulmonary/Chest: Effort normal and breath sounds normal.  Abdominal: Soft. Bowel sounds are normal. He exhibits no distension and no mass. There is no tenderness. There is no rebound and no guarding.          Assessment & Plan:  Diverticulitis, partially resolved. He will finish out the antibiotics. By next week he can resume a normal diet and normal activities. He will be due for a colonoscopy later this year.

## 2015-05-24 ENCOUNTER — Telehealth: Payer: Self-pay | Admitting: Family Medicine

## 2015-05-24 MED ORDER — OSELTAMIVIR PHOSPHATE 75 MG PO CAPS: ORAL_CAPSULE | ORAL | Status: DC

## 2015-05-24 NOTE — Telephone Encounter (Signed)
[facility] - Client TELEPHONE ADVICE RECORD [facility] Patient Name: Brent Bennett DOB: 05-Feb-1966 Initial Comment Caller states she wants Tamiflu for the PT Nurse Assessment Nurse: Izola Price, RN, Cala Bradford Date/Time Lamount Cohen Time): 05/24/2015 3:38:41 PM Confirm and document reason for call. If symptomatic, describe symptoms. You must click the next button to save text entered. ---caller states she would like tamiflu for her husband as he has been exposed to flu by dtr (tested positive for the flu). Wants tamiflu for prophalyxis only. no symptoms Has the patient traveled out of the country within the last 30 days? ---Not Applicable Does the patient have any new or worsening symptoms? ---No Please document clinical information provided and list any resource used. ---requesting script for tamiflu (his MD isn't in but would like to see if another Md can call it in). Guidelines Guideline Title Affirmed Question Affirmed Notes Final Disposition User Clinical Call Izola Price, RN, Cala Bradford

## 2015-05-24 NOTE — Telephone Encounter (Signed)
Asked Kandee Keen if okay to send Tamiflu for pt, exposed to daughter with FLU and she is on Tamiflu. Kandee Keen said okay to send Tamiflu.  Left message on voicemail Rx for Tamiflu  sent to Pharmacy.

## 2015-05-30 ENCOUNTER — Telehealth: Payer: Self-pay | Admitting: Family Medicine

## 2015-05-30 NOTE — Telephone Encounter (Signed)
Call in Zolpidem 10 mg qhs prn sleep, #10 with 2 rf

## 2015-06-01 MED ORDER — ZOLPIDEM TARTRATE 10 MG PO TABS: 10.0000 mg | ORAL_TABLET | Freq: Every evening | ORAL | Status: DC | PRN

## 2015-06-01 NOTE — Telephone Encounter (Signed)
Rx called in as directed.   

## 2015-06-01 NOTE — Telephone Encounter (Signed)
Please see note.

## 2015-06-01 NOTE — Telephone Encounter (Signed)
done

## 2015-06-21 ENCOUNTER — Other Ambulatory Visit (INDEPENDENT_AMBULATORY_CARE_PROVIDER_SITE_OTHER): Payer: BLUE CROSS/BLUE SHIELD

## 2015-06-21 DIAGNOSIS — Z0000 Encounter for general adult medical examination without abnormal findings: Secondary | ICD-10-CM

## 2015-06-21 LAB — HEPATIC FUNCTION PANEL
ALBUMIN: 4.1 g/dL (ref 3.5–5.2)
ALK PHOS: 75 U/L (ref 39–117)
ALT: 28 U/L (ref 0–53)
AST: 27 U/L (ref 0–37)
BILIRUBIN TOTAL: 1.3 mg/dL — AB (ref 0.2–1.2)
Bilirubin, Direct: 0.2 mg/dL (ref 0.0–0.3)
Total Protein: 6.7 g/dL (ref 6.0–8.3)

## 2015-06-21 LAB — POC URINALSYSI DIPSTICK (AUTOMATED)
Bilirubin, UA: NEGATIVE
GLUCOSE UA: NEGATIVE
Ketones, UA: NEGATIVE
Leukocytes, UA: NEGATIVE
NITRITE UA: NEGATIVE
PH UA: 5.5
Protein, UA: NEGATIVE
RBC UA: NEGATIVE
Spec Grav, UA: 1.015
UROBILINOGEN UA: 0.2

## 2015-06-21 LAB — LIPID PANEL
CHOLESTEROL: 190 mg/dL (ref 0–200)
HDL: 40.7 mg/dL (ref >39.00)
LDL Cholesterol: 119 mg/dL — ABNORMAL HIGH (ref 0–99)
NonHDL: 149.7
TRIGLYCERIDES: 156 mg/dL — AB (ref 0.0–149.0)
Total CHOL/HDL Ratio: 5
VLDL: 31.2 mg/dL (ref 0.0–40.0)

## 2015-06-21 LAB — BASIC METABOLIC PANEL
BUN: 14 mg/dL (ref 6–23)
CALCIUM: 9.2 mg/dL (ref 8.4–10.5)
CO2: 31 meq/L (ref 19–32)
CREATININE: 0.97 mg/dL (ref 0.40–1.50)
Chloride: 102 mEq/L (ref 96–112)
GFR: 87.2 mL/min (ref >60.00)
GLUCOSE: 96 mg/dL (ref 70–99)
Potassium: 3.8 mEq/L (ref 3.5–5.1)
Sodium: 138 mEq/L (ref 135–145)

## 2015-06-21 LAB — CBC WITH DIFFERENTIAL/PLATELET
BASOS ABS: 0 10*3/uL (ref 0.0–0.1)
Basophils Relative: 0.4 % (ref 0.0–3.0)
EOS ABS: 0.2 10*3/uL (ref 0.0–0.7)
Eosinophils Relative: 3.7 % (ref 0.0–5.0)
HCT: 44 % (ref 39.0–52.0)
Hemoglobin: 15.1 g/dL (ref 13.0–17.0)
LYMPHS ABS: 2.1 10*3/uL (ref 0.7–4.0)
Lymphocytes Relative: 34 % (ref 12.0–46.0)
MCHC: 34.3 g/dL (ref 30.0–36.0)
MCV: 88.1 fl (ref 78.0–100.0)
MONO ABS: 0.5 10*3/uL (ref 0.1–1.0)
MONOS PCT: 8 % (ref 3.0–12.0)
NEUTROS ABS: 3.4 10*3/uL (ref 1.4–7.7)
NEUTROS PCT: 53.9 % (ref 43.0–77.0)
PLATELETS: 237 10*3/uL (ref 150.0–400.0)
RBC: 4.99 Mil/uL (ref 4.22–5.81)
RDW: 12.6 % (ref 11.5–15.5)
WBC: 6.3 10*3/uL (ref 4.0–10.5)

## 2015-06-21 LAB — TSH: TSH: 1.57 u[IU]/mL (ref 0.35–4.50)

## 2015-06-26 ENCOUNTER — Encounter: Payer: Self-pay | Admitting: Family Medicine

## 2015-06-26 ENCOUNTER — Ambulatory Visit (INDEPENDENT_AMBULATORY_CARE_PROVIDER_SITE_OTHER): Payer: BLUE CROSS/BLUE SHIELD | Admitting: Family Medicine

## 2015-06-26 VITALS — BP 127/78 | Temp 98.0°F | Ht 67.5 in | Wt 178.0 lb

## 2015-06-26 DIAGNOSIS — Z0000 Encounter for general adult medical examination without abnormal findings: Secondary | ICD-10-CM

## 2015-06-26 DIAGNOSIS — J342 Deviated nasal septum: Secondary | ICD-10-CM

## 2015-06-26 DIAGNOSIS — Z23 Encounter for immunization: Secondary | ICD-10-CM

## 2015-06-26 DIAGNOSIS — E785 Hyperlipidemia, unspecified: Secondary | ICD-10-CM

## 2015-06-26 NOTE — Progress Notes (Signed)
Pre visit review using our clinic review tool, if applicable. No additional management support is needed unless otherwise documented below in the visit note. 

## 2015-06-26 NOTE — Progress Notes (Signed)
   Subjective:    Patient ID: Brent Bennett, male    DOB: 07-Jan-1966, 50 y.o.   MRN: [id number]  HPI [age] yr ole male for a cpx. He feels well in general. A month ago he was treated for sigmoid diverticulitis and he has totally recovered. He does mention trouble breathing through the left nostril and he snores a lot at night. He has been told he has a deviated septum. He had his nose broken 3-4 times playing hockey.    Review of Systems  Constitutional: Negative.   HENT: Negative.   Eyes: Negative.   Respiratory: Negative.   Cardiovascular: Negative.   Gastrointestinal: Negative.   Genitourinary: Negative.   Musculoskeletal: Negative.   Skin: Negative.   Neurological: Negative.   Psychiatric/Behavioral: Negative.        Objective:   Physical Exam  Constitutional: He is oriented to person, place, and time. He appears well-developed and well-nourished. No distress.  HENT:  Head: Normocephalic and atraumatic.  Right Ear: External ear normal.  Left Ear: External ear normal.  Nose: Nose normal.  Mouth/Throat: Oropharynx is clear and moist. No oropharyngeal exudate.  Eyes: Conjunctivae and EOM are normal. Pupils are equal, round, and reactive to light. Right eye exhibits no discharge. Left eye exhibits no discharge. No scleral icterus.  Neck: Neck supple. No JVD present. No tracheal deviation present. No thyromegaly present.  Cardiovascular: Normal rate, regular rhythm, normal heart sounds and intact distal pulses.  Exam reveals no gallop and no friction rub.   No murmur heard. Pulmonary/Chest: Effort normal and breath sounds normal. No respiratory distress. He has no wheezes. He has no rales. He exhibits no tenderness.  Abdominal: Soft. Bowel sounds are normal. He exhibits no distension and no mass. There is no tenderness. There is no rebound and no guarding.  Genitourinary: Rectum normal, prostate normal and penis normal. Guaiac negative stool. No penile tenderness.    Musculoskeletal: Normal range of motion. He exhibits no edema or tenderness.  Lymphadenopathy:    He has no cervical adenopathy.  Neurological: He is alert and oriented to person, place, and time. He has normal reflexes. No cranial nerve deficit. He exhibits normal muscle tone. Coordination normal.  Skin: Skin is warm and dry. No rash noted. He is not diaphoretic. No erythema. No pallor.  Psychiatric: He has a normal mood and affect. His behavior is normal. Judgment and thought content normal.          Assessment & Plan:  Well exam. We discussed diet and exercise. Refer to ENT for the deviated septum.

## 2015-07-05 ENCOUNTER — Telehealth: Payer: Self-pay | Admitting: Family Medicine

## 2015-07-05 MED ORDER — AZITHROMYCIN 250 MG PO TABS: ORAL_TABLET | ORAL | Status: DC

## 2015-07-05 NOTE — Telephone Encounter (Signed)
done

## 2015-07-12 DIAGNOSIS — J3089 Other allergic rhinitis: Secondary | ICD-10-CM | POA: Diagnosis not present

## 2015-07-12 DIAGNOSIS — J3081 Allergic rhinitis due to animal (cat) (dog) hair and dander: Secondary | ICD-10-CM | POA: Diagnosis not present

## 2015-07-12 DIAGNOSIS — J301 Allergic rhinitis due to pollen: Secondary | ICD-10-CM | POA: Diagnosis not present

## 2015-07-18 DIAGNOSIS — J3089 Other allergic rhinitis: Secondary | ICD-10-CM | POA: Diagnosis not present

## 2015-07-18 DIAGNOSIS — J3081 Allergic rhinitis due to animal (cat) (dog) hair and dander: Secondary | ICD-10-CM | POA: Diagnosis not present

## 2015-07-18 DIAGNOSIS — J301 Allergic rhinitis due to pollen: Secondary | ICD-10-CM | POA: Diagnosis not present

## 2015-07-23 DIAGNOSIS — J3081 Allergic rhinitis due to animal (cat) (dog) hair and dander: Secondary | ICD-10-CM | POA: Diagnosis not present

## 2015-07-23 DIAGNOSIS — J3089 Other allergic rhinitis: Secondary | ICD-10-CM | POA: Diagnosis not present

## 2015-07-23 DIAGNOSIS — J301 Allergic rhinitis due to pollen: Secondary | ICD-10-CM | POA: Diagnosis not present

## 2015-07-25 DIAGNOSIS — J301 Allergic rhinitis due to pollen: Secondary | ICD-10-CM | POA: Diagnosis not present

## 2015-07-25 DIAGNOSIS — J3081 Allergic rhinitis due to animal (cat) (dog) hair and dander: Secondary | ICD-10-CM | POA: Diagnosis not present

## 2015-07-25 DIAGNOSIS — J3089 Other allergic rhinitis: Secondary | ICD-10-CM | POA: Diagnosis not present

## 2015-08-03 DIAGNOSIS — J3081 Allergic rhinitis due to animal (cat) (dog) hair and dander: Secondary | ICD-10-CM | POA: Diagnosis not present

## 2015-08-03 DIAGNOSIS — J301 Allergic rhinitis due to pollen: Secondary | ICD-10-CM | POA: Diagnosis not present

## 2015-08-03 DIAGNOSIS — J3089 Other allergic rhinitis: Secondary | ICD-10-CM | POA: Diagnosis not present

## 2015-08-06 DIAGNOSIS — J3081 Allergic rhinitis due to animal (cat) (dog) hair and dander: Secondary | ICD-10-CM | POA: Diagnosis not present

## 2015-08-06 DIAGNOSIS — J3089 Other allergic rhinitis: Secondary | ICD-10-CM | POA: Diagnosis not present

## 2015-08-06 DIAGNOSIS — J301 Allergic rhinitis due to pollen: Secondary | ICD-10-CM | POA: Diagnosis not present

## 2015-08-08 DIAGNOSIS — J3089 Other allergic rhinitis: Secondary | ICD-10-CM | POA: Diagnosis not present

## 2015-08-08 DIAGNOSIS — J3081 Allergic rhinitis due to animal (cat) (dog) hair and dander: Secondary | ICD-10-CM | POA: Diagnosis not present

## 2015-08-08 DIAGNOSIS — J301 Allergic rhinitis due to pollen: Secondary | ICD-10-CM | POA: Diagnosis not present

## 2015-08-17 DIAGNOSIS — J3089 Other allergic rhinitis: Secondary | ICD-10-CM | POA: Diagnosis not present

## 2015-08-17 DIAGNOSIS — J3081 Allergic rhinitis due to animal (cat) (dog) hair and dander: Secondary | ICD-10-CM | POA: Diagnosis not present

## 2015-08-17 DIAGNOSIS — J301 Allergic rhinitis due to pollen: Secondary | ICD-10-CM | POA: Diagnosis not present

## 2015-08-28 DIAGNOSIS — J3089 Other allergic rhinitis: Secondary | ICD-10-CM | POA: Diagnosis not present

## 2015-08-28 DIAGNOSIS — J3081 Allergic rhinitis due to animal (cat) (dog) hair and dander: Secondary | ICD-10-CM | POA: Diagnosis not present

## 2015-08-28 DIAGNOSIS — J301 Allergic rhinitis due to pollen: Secondary | ICD-10-CM | POA: Diagnosis not present

## 2015-09-13 DIAGNOSIS — J3081 Allergic rhinitis due to animal (cat) (dog) hair and dander: Secondary | ICD-10-CM | POA: Diagnosis not present

## 2015-09-13 DIAGNOSIS — J301 Allergic rhinitis due to pollen: Secondary | ICD-10-CM | POA: Diagnosis not present

## 2015-09-13 DIAGNOSIS — J3089 Other allergic rhinitis: Secondary | ICD-10-CM | POA: Diagnosis not present

## 2015-09-24 DIAGNOSIS — J3081 Allergic rhinitis due to animal (cat) (dog) hair and dander: Secondary | ICD-10-CM | POA: Diagnosis not present

## 2015-09-24 DIAGNOSIS — J301 Allergic rhinitis due to pollen: Secondary | ICD-10-CM | POA: Diagnosis not present

## 2015-09-24 DIAGNOSIS — J3089 Other allergic rhinitis: Secondary | ICD-10-CM | POA: Diagnosis not present

## 2015-10-15 DIAGNOSIS — J3081 Allergic rhinitis due to animal (cat) (dog) hair and dander: Secondary | ICD-10-CM | POA: Diagnosis not present

## 2015-10-15 DIAGNOSIS — J3089 Other allergic rhinitis: Secondary | ICD-10-CM | POA: Diagnosis not present

## 2015-10-15 DIAGNOSIS — J301 Allergic rhinitis due to pollen: Secondary | ICD-10-CM | POA: Diagnosis not present

## 2015-10-23 DIAGNOSIS — L814 Other melanin hyperpigmentation: Secondary | ICD-10-CM | POA: Diagnosis not present

## 2015-10-23 DIAGNOSIS — L700 Acne vulgaris: Secondary | ICD-10-CM | POA: Diagnosis not present

## 2015-10-23 DIAGNOSIS — D1801 Hemangioma of skin and subcutaneous tissue: Secondary | ICD-10-CM | POA: Diagnosis not present

## 2015-10-26 DIAGNOSIS — J3081 Allergic rhinitis due to animal (cat) (dog) hair and dander: Secondary | ICD-10-CM | POA: Diagnosis not present

## 2015-10-26 DIAGNOSIS — J301 Allergic rhinitis due to pollen: Secondary | ICD-10-CM | POA: Diagnosis not present

## 2015-10-26 DIAGNOSIS — J3089 Other allergic rhinitis: Secondary | ICD-10-CM | POA: Diagnosis not present

## 2015-10-29 DIAGNOSIS — H1045 Other chronic allergic conjunctivitis: Secondary | ICD-10-CM | POA: Diagnosis not present

## 2015-10-29 DIAGNOSIS — J301 Allergic rhinitis due to pollen: Secondary | ICD-10-CM | POA: Diagnosis not present

## 2015-10-29 DIAGNOSIS — J3089 Other allergic rhinitis: Secondary | ICD-10-CM | POA: Diagnosis not present

## 2015-10-29 DIAGNOSIS — J3081 Allergic rhinitis due to animal (cat) (dog) hair and dander: Secondary | ICD-10-CM | POA: Diagnosis not present

## 2015-11-01 DIAGNOSIS — J3089 Other allergic rhinitis: Secondary | ICD-10-CM | POA: Diagnosis not present

## 2015-11-01 DIAGNOSIS — J301 Allergic rhinitis due to pollen: Secondary | ICD-10-CM | POA: Diagnosis not present

## 2015-11-01 DIAGNOSIS — J3081 Allergic rhinitis due to animal (cat) (dog) hair and dander: Secondary | ICD-10-CM | POA: Diagnosis not present

## 2015-11-19 DIAGNOSIS — J301 Allergic rhinitis due to pollen: Secondary | ICD-10-CM | POA: Diagnosis not present

## 2015-11-19 DIAGNOSIS — J3089 Other allergic rhinitis: Secondary | ICD-10-CM | POA: Diagnosis not present

## 2015-11-19 DIAGNOSIS — J3081 Allergic rhinitis due to animal (cat) (dog) hair and dander: Secondary | ICD-10-CM | POA: Diagnosis not present

## 2015-11-23 DIAGNOSIS — J3089 Other allergic rhinitis: Secondary | ICD-10-CM | POA: Diagnosis not present

## 2015-11-23 DIAGNOSIS — J3081 Allergic rhinitis due to animal (cat) (dog) hair and dander: Secondary | ICD-10-CM | POA: Diagnosis not present

## 2015-11-23 DIAGNOSIS — J301 Allergic rhinitis due to pollen: Secondary | ICD-10-CM | POA: Diagnosis not present

## 2015-11-26 DIAGNOSIS — J3081 Allergic rhinitis due to animal (cat) (dog) hair and dander: Secondary | ICD-10-CM | POA: Diagnosis not present

## 2015-11-26 DIAGNOSIS — J301 Allergic rhinitis due to pollen: Secondary | ICD-10-CM | POA: Diagnosis not present

## 2015-11-26 DIAGNOSIS — J3089 Other allergic rhinitis: Secondary | ICD-10-CM | POA: Diagnosis not present

## 2015-12-14 DIAGNOSIS — J301 Allergic rhinitis due to pollen: Secondary | ICD-10-CM | POA: Diagnosis not present

## 2015-12-14 DIAGNOSIS — J3089 Other allergic rhinitis: Secondary | ICD-10-CM | POA: Diagnosis not present

## 2015-12-14 DIAGNOSIS — J3081 Allergic rhinitis due to animal (cat) (dog) hair and dander: Secondary | ICD-10-CM | POA: Diagnosis not present

## 2015-12-21 DIAGNOSIS — J3089 Other allergic rhinitis: Secondary | ICD-10-CM | POA: Diagnosis not present

## 2015-12-21 DIAGNOSIS — J3081 Allergic rhinitis due to animal (cat) (dog) hair and dander: Secondary | ICD-10-CM | POA: Diagnosis not present

## 2015-12-21 DIAGNOSIS — J301 Allergic rhinitis due to pollen: Secondary | ICD-10-CM | POA: Diagnosis not present

## 2015-12-26 DIAGNOSIS — J3081 Allergic rhinitis due to animal (cat) (dog) hair and dander: Secondary | ICD-10-CM | POA: Diagnosis not present

## 2015-12-26 DIAGNOSIS — J301 Allergic rhinitis due to pollen: Secondary | ICD-10-CM | POA: Diagnosis not present

## 2015-12-26 DIAGNOSIS — J3089 Other allergic rhinitis: Secondary | ICD-10-CM | POA: Diagnosis not present

## 2015-12-27 DIAGNOSIS — J301 Allergic rhinitis due to pollen: Secondary | ICD-10-CM | POA: Diagnosis not present

## 2015-12-28 DIAGNOSIS — J3081 Allergic rhinitis due to animal (cat) (dog) hair and dander: Secondary | ICD-10-CM | POA: Diagnosis not present

## 2015-12-28 DIAGNOSIS — J3089 Other allergic rhinitis: Secondary | ICD-10-CM | POA: Diagnosis not present

## 2016-01-07 DIAGNOSIS — J3089 Other allergic rhinitis: Secondary | ICD-10-CM | POA: Diagnosis not present

## 2016-01-07 DIAGNOSIS — J3081 Allergic rhinitis due to animal (cat) (dog) hair and dander: Secondary | ICD-10-CM | POA: Diagnosis not present

## 2016-01-07 DIAGNOSIS — J301 Allergic rhinitis due to pollen: Secondary | ICD-10-CM | POA: Diagnosis not present

## 2016-01-11 DIAGNOSIS — M7661 Achilles tendinitis, right leg: Secondary | ICD-10-CM | POA: Insufficient documentation

## 2016-01-17 ENCOUNTER — Telehealth: Payer: Self-pay | Admitting: Family Medicine

## 2016-01-17 NOTE — Telephone Encounter (Signed)
Pt has a family history of  liver cancer and would like to know  if he should get liver scan

## 2016-01-17 NOTE — Telephone Encounter (Signed)
Dr. Clent RidgesFry would only recommend a ultra sound, not the CT.

## 2016-01-22 NOTE — Telephone Encounter (Signed)
I left a voice message with below information, advised pt to call back if he wants to proceed with ultra sound.

## 2016-01-24 DIAGNOSIS — J3081 Allergic rhinitis due to animal (cat) (dog) hair and dander: Secondary | ICD-10-CM | POA: Diagnosis not present

## 2016-01-24 DIAGNOSIS — J301 Allergic rhinitis due to pollen: Secondary | ICD-10-CM | POA: Diagnosis not present

## 2016-01-24 DIAGNOSIS — J3089 Other allergic rhinitis: Secondary | ICD-10-CM | POA: Diagnosis not present

## 2016-02-01 DIAGNOSIS — J3089 Other allergic rhinitis: Secondary | ICD-10-CM | POA: Diagnosis not present

## 2016-02-01 DIAGNOSIS — J3081 Allergic rhinitis due to animal (cat) (dog) hair and dander: Secondary | ICD-10-CM | POA: Diagnosis not present

## 2016-02-01 DIAGNOSIS — D225 Melanocytic nevi of trunk: Secondary | ICD-10-CM | POA: Diagnosis not present

## 2016-02-01 DIAGNOSIS — D485 Neoplasm of uncertain behavior of skin: Secondary | ICD-10-CM | POA: Diagnosis not present

## 2016-02-01 DIAGNOSIS — L905 Scar conditions and fibrosis of skin: Secondary | ICD-10-CM | POA: Diagnosis not present

## 2016-02-01 DIAGNOSIS — J301 Allergic rhinitis due to pollen: Secondary | ICD-10-CM | POA: Diagnosis not present

## 2016-02-01 DIAGNOSIS — L719 Rosacea, unspecified: Secondary | ICD-10-CM | POA: Diagnosis not present

## 2016-02-01 DIAGNOSIS — Z23 Encounter for immunization: Secondary | ICD-10-CM | POA: Diagnosis not present

## 2016-02-26 DIAGNOSIS — J301 Allergic rhinitis due to pollen: Secondary | ICD-10-CM | POA: Diagnosis not present

## 2016-02-26 DIAGNOSIS — J3081 Allergic rhinitis due to animal (cat) (dog) hair and dander: Secondary | ICD-10-CM | POA: Diagnosis not present

## 2016-02-26 DIAGNOSIS — J3089 Other allergic rhinitis: Secondary | ICD-10-CM | POA: Diagnosis not present

## 2016-03-05 ENCOUNTER — Encounter: Payer: Self-pay | Admitting: Family Medicine

## 2016-03-05 ENCOUNTER — Ambulatory Visit (INDEPENDENT_AMBULATORY_CARE_PROVIDER_SITE_OTHER): Payer: BLUE CROSS/BLUE SHIELD | Admitting: Family Medicine

## 2016-03-05 VITALS — BP 126/74 | HR 71 | Temp 97.6°F | Wt 176.8 lb

## 2016-03-05 DIAGNOSIS — Z800 Family history of malignant neoplasm of digestive organs: Secondary | ICD-10-CM | POA: Diagnosis not present

## 2016-03-05 DIAGNOSIS — J019 Acute sinusitis, unspecified: Secondary | ICD-10-CM

## 2016-03-05 MED ORDER — METHYLPREDNISOLONE ACETATE 80 MG/ML IJ SUSP
80.0000 mg | Freq: Once | INTRAMUSCULAR | Status: AC
  Administered 2016-03-05: 120 mg via INTRAMUSCULAR

## 2016-03-05 MED ORDER — AZITHROMYCIN 250 MG PO TABS: ORAL_TABLET | ORAL | 0 refills | Status: DC

## 2016-03-05 NOTE — Progress Notes (Signed)
Subjective:     Patient ID: Brent Bennett, male   DOB: 06/06/1965, 50 y.o.   MRN: [id number]  HPI Here for several things. First he has had sinus congestion for [redacted] weeks along with PND and blowing green mucus from the nose. No fever or cough. Also he asks if he can get an US of his liver. His brother has been diagnosed with primary liver cancer, and he apparently has chronic hepatitis C and has abused alcohol for years. The brother's doctor advised that his family members be screened.  Of note Brent Bennett had a cpx here last March and his liver enzymes were all normal.   Review of Systems  Constitutional: Negative.   HENT: Positive for congestion, postnasal drip, sinus pain and sinus pressure. Negative for ear pain and sore throat.   Eyes: Negative.   Respiratory: Negative.   Gastrointestinal: Negative.        Objective:   Physical Exam  Constitutional: He is oriented to person, place, and time. He appears well-developed and well-nourished.  HENT:  Right Ear: External ear normal.  Left Ear: External ear normal.  Nose: Nose normal.  Mouth/Throat: Oropharynx is clear and moist.  Eyes: Conjunctivae are normal. No scleral icterus.  Neck: Neck supple. No thyromegaly present.  Cardiovascular: Normal rate, regular rhythm, normal heart sounds and intact distal pulses.   Pulmonary/Chest: Effort normal and breath sounds normal. No respiratory distress. He has no wheezes. He has no rales. He exhibits no tenderness.  Abdominal: Soft. Bowel sounds are normal. He exhibits no distension and no mass. There is no tenderness. There is no rebound and no guarding.  No HSM  Lymphadenopathy:    He has no cervical adenopathy.  Neurological: He is alert and oriented to person, place, and time.       Assessment:  Treat the sinusitis with a steroid shot and a Zpack. Screen for liver cancer with an abdominal US.     Plan:     As above.

## 2016-03-05 NOTE — Progress Notes (Signed)
   Subjective:    Patient ID: Brent BlonderMichael A Escher, male    DOB: 04/29/1965, [age] y.o.   MRN: [id number]  HPI  Duplicate note   Review of Systems     Objective:   Physical Exam        Assessment & Plan:

## 2016-03-05 NOTE — Progress Notes (Signed)
Pre visit review using our clinic review tool, if applicable. No additional management support is needed unless otherwise documented below in the visit note. 

## 2016-03-14 DIAGNOSIS — J301 Allergic rhinitis due to pollen: Secondary | ICD-10-CM | POA: Diagnosis not present

## 2016-03-14 DIAGNOSIS — J3089 Other allergic rhinitis: Secondary | ICD-10-CM | POA: Diagnosis not present

## 2016-03-14 DIAGNOSIS — J3081 Allergic rhinitis due to animal (cat) (dog) hair and dander: Secondary | ICD-10-CM | POA: Diagnosis not present

## 2016-03-18 DIAGNOSIS — J3081 Allergic rhinitis due to animal (cat) (dog) hair and dander: Secondary | ICD-10-CM | POA: Diagnosis not present

## 2016-03-18 DIAGNOSIS — J3089 Other allergic rhinitis: Secondary | ICD-10-CM | POA: Diagnosis not present

## 2016-03-18 DIAGNOSIS — J301 Allergic rhinitis due to pollen: Secondary | ICD-10-CM | POA: Diagnosis not present

## 2016-03-21 ENCOUNTER — Ambulatory Visit
Admission: RE | Admit: 2016-03-21 | Discharge: 2016-03-21 | Disposition: A | Discharge status: Home / Self Care | Payer: BLUE CROSS/BLUE SHIELD | Source: Ambulatory Visit | Attending: Family Medicine | Admitting: Family Medicine

## 2016-03-21 DIAGNOSIS — J3089 Other allergic rhinitis: Secondary | ICD-10-CM | POA: Diagnosis not present

## 2016-03-21 DIAGNOSIS — J3081 Allergic rhinitis due to animal (cat) (dog) hair and dander: Secondary | ICD-10-CM | POA: Diagnosis not present

## 2016-03-21 DIAGNOSIS — J301 Allergic rhinitis due to pollen: Secondary | ICD-10-CM | POA: Diagnosis not present

## 2016-03-21 DIAGNOSIS — Z800 Family history of malignant neoplasm of digestive organs: Secondary | ICD-10-CM

## 2016-03-21 DIAGNOSIS — N281 Cyst of kidney, acquired: Secondary | ICD-10-CM | POA: Diagnosis not present

## 2016-03-21 IMAGING — US US ABDOMEN COMPLETE
1 series · 14 of 25 positions shown · non-contrast
Comparison: CT abdomen [DATE]

CLINICAL DATA: Family history primary liver cancer, asymptomatic

EXAM:
ABDOMEN ULTRASOUND COMPLETE

[Series 1: us abdomen complete · 0.32mm/px · 14 of 69 slices shown]
[im 1/69]
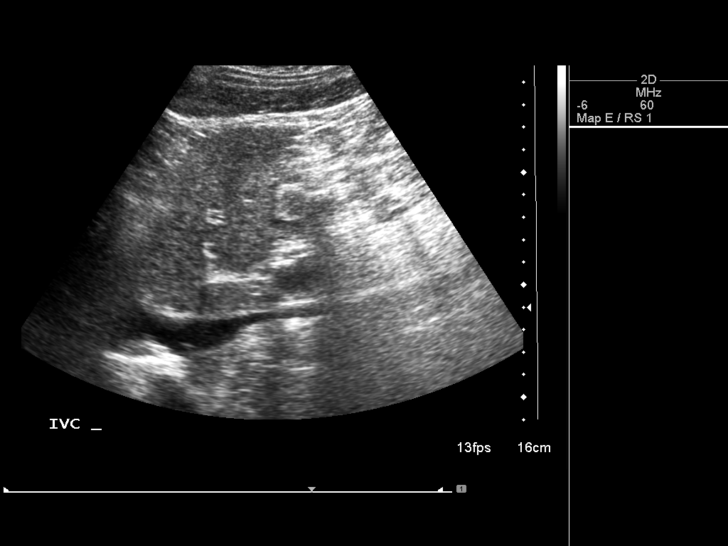
[im 6/69]
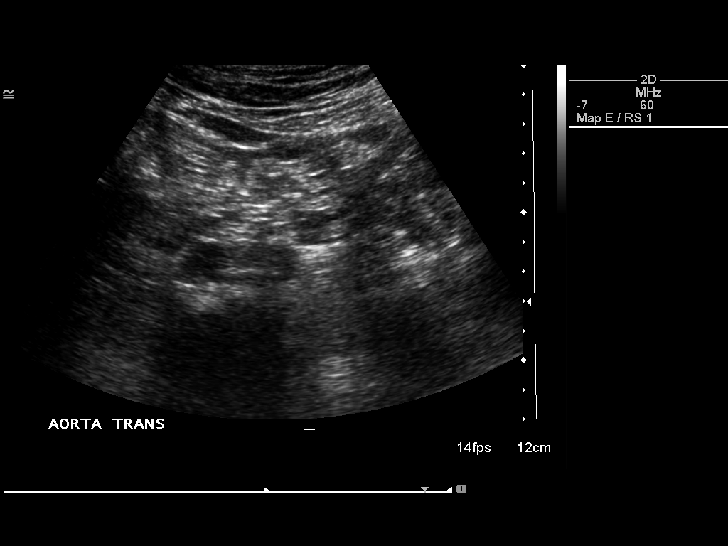
[im 12/69]
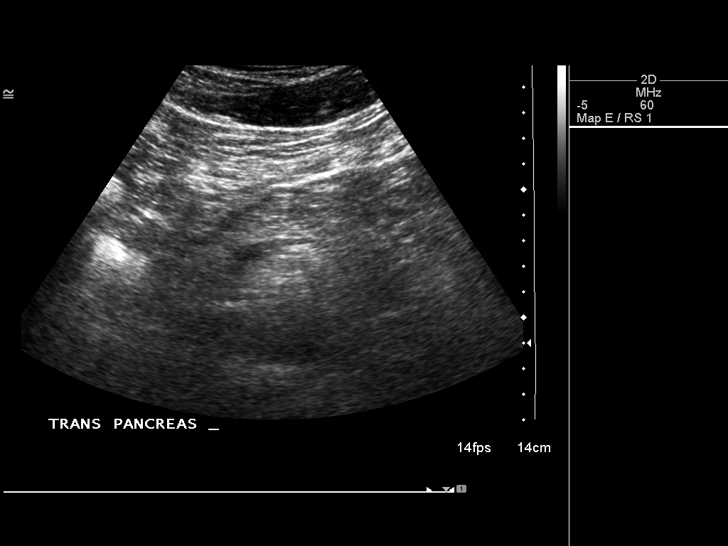
[im 18/69]
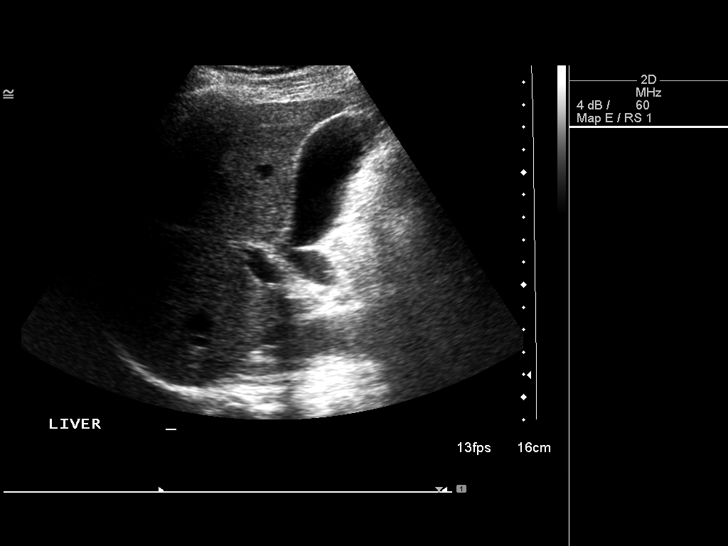
[im 23/69]
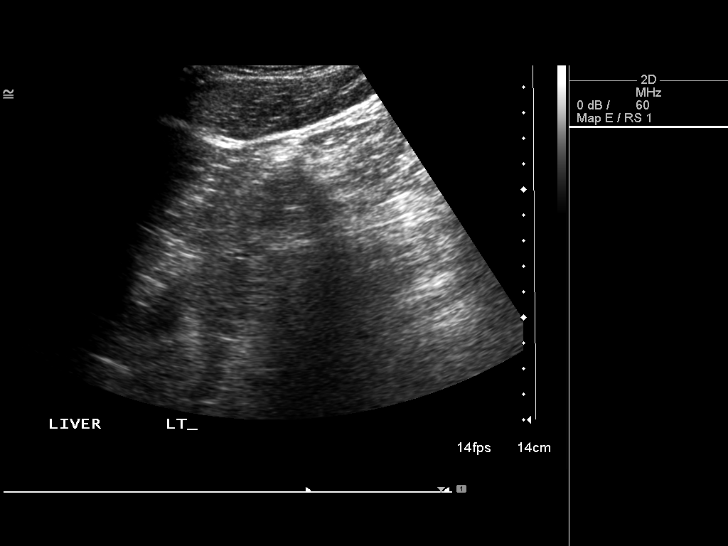
[im 26/69]
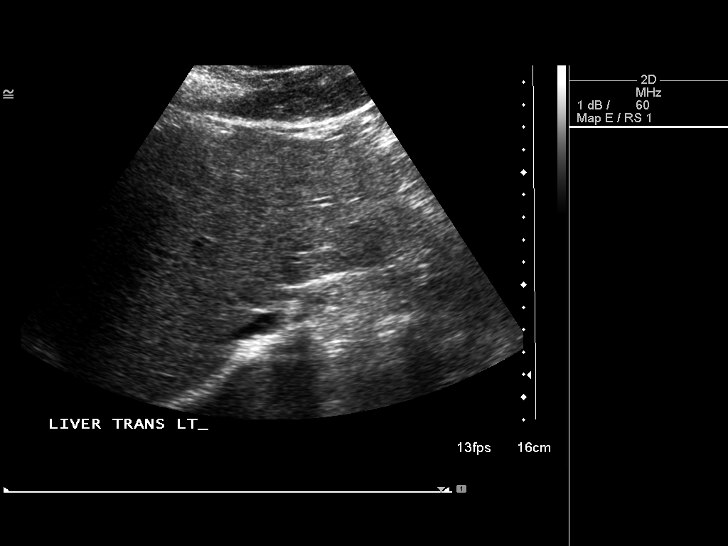
[im 32/69]
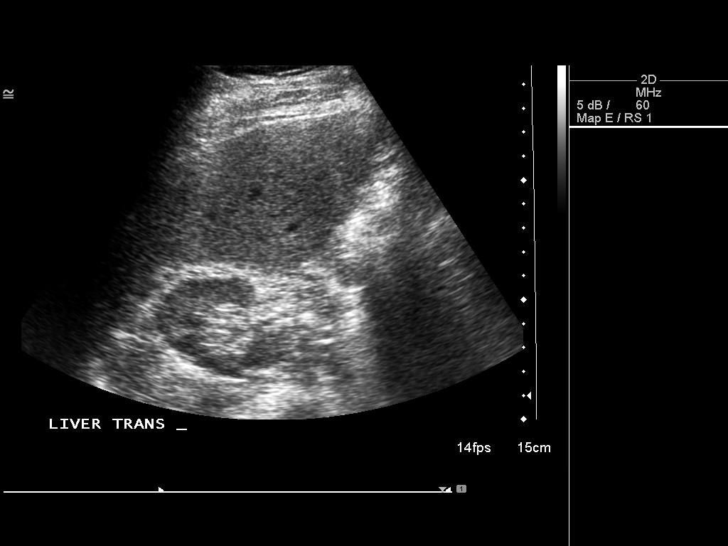
[im 37/69]
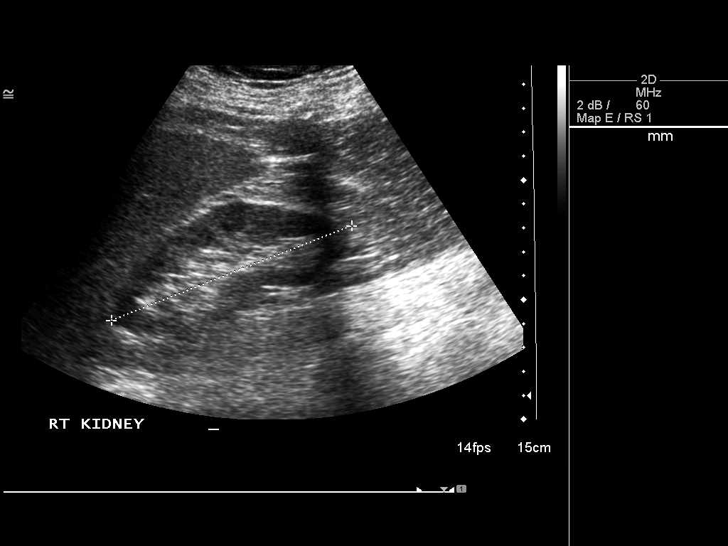
[im 43/69]
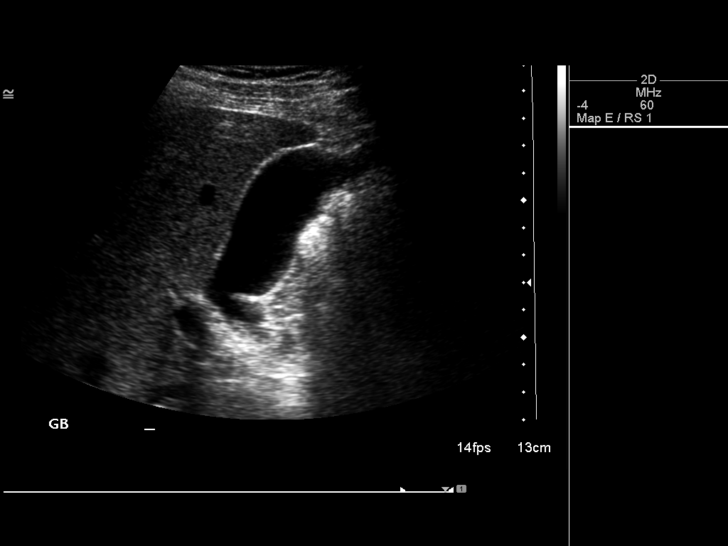
[im 46/69]
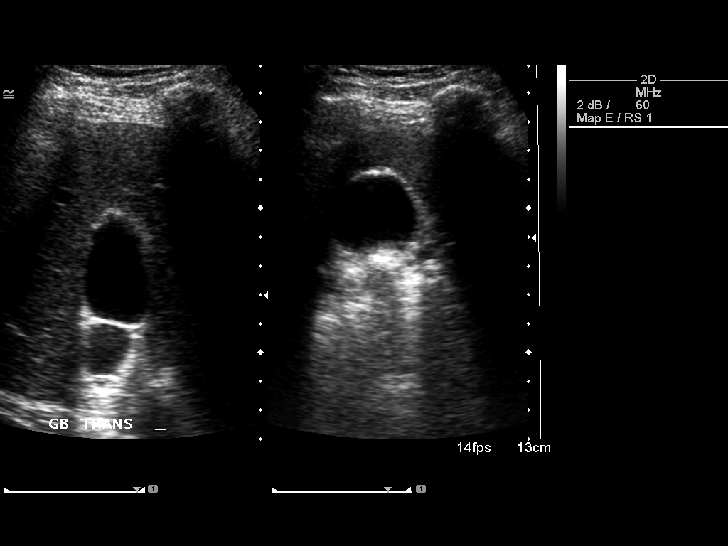
[im 52/69]
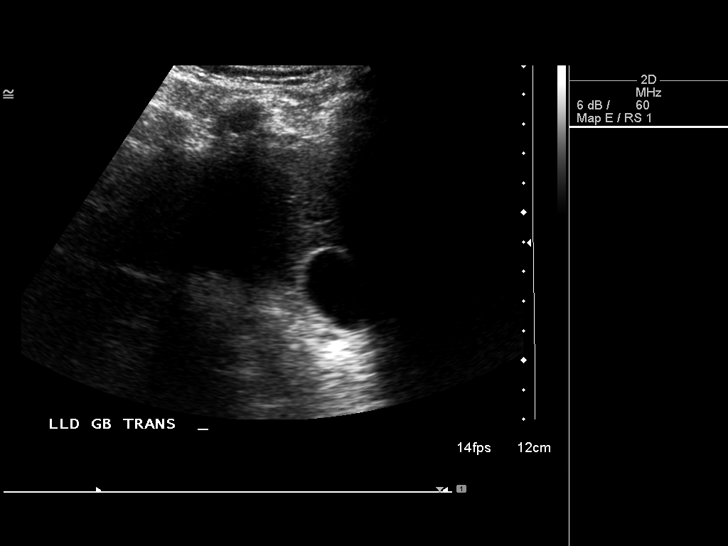
[im 57/69]
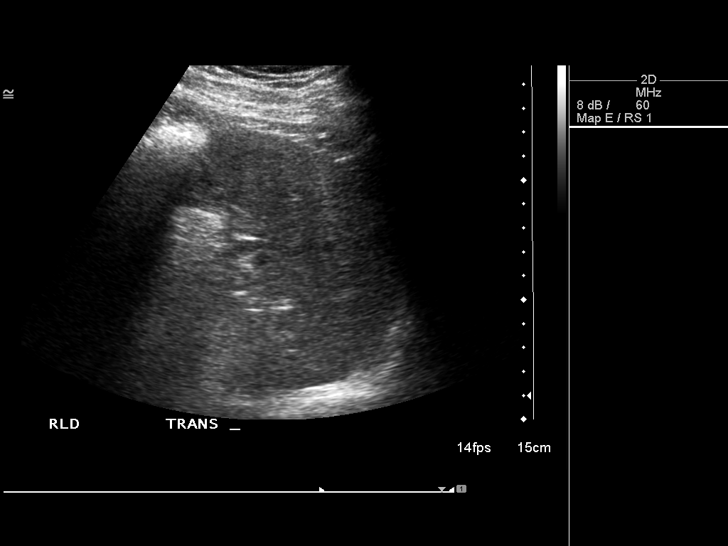
[im 63/69]
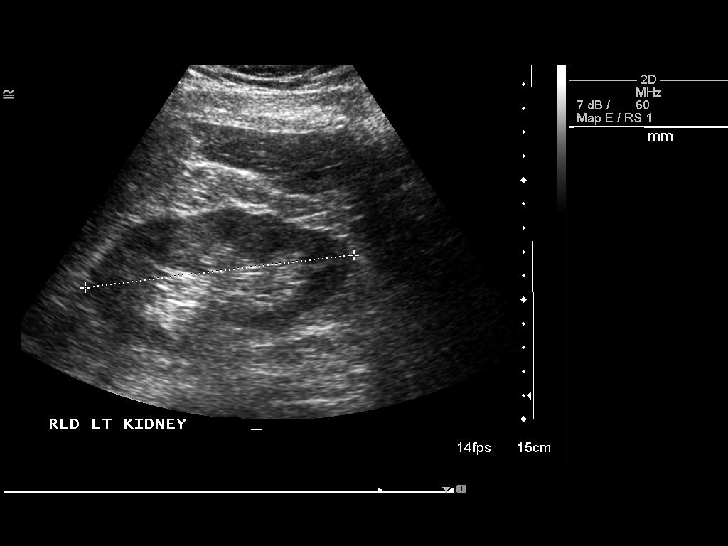
[im 69/69]
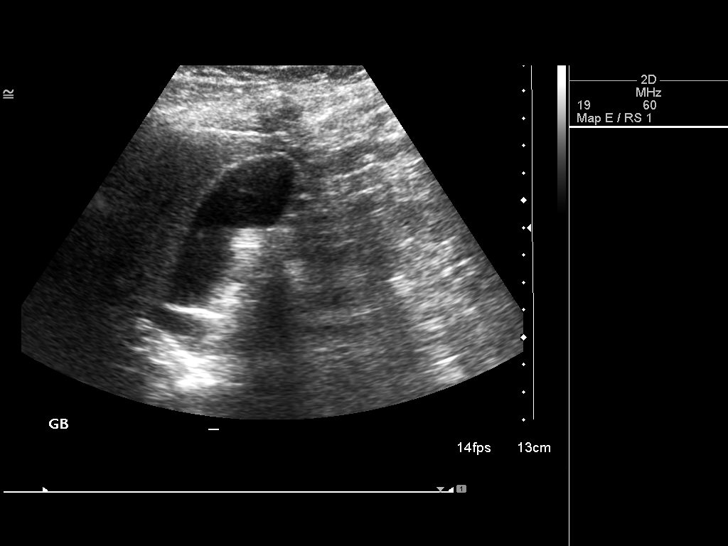

[14 of 25 positions shown; findings below may reference images not displayed]

FINDINGS: Gallbladder: Normally distended without stones or wall thickening.
No pericholecystic fluid or sonographic Murphy sign.

Common bile duct: Diameter: 3 mm diameter , normal

Liver: Normal appearance

IVC: Normal appearance

Pancreas: Partially obscured by bowel gas, no gross abnormality
identified within visualized portion.

Spleen: Normal appearance, 6.0 cm length

Right Kidney: Length: 11.3 cm. Normal morphology without mass or
hydronephrosis.

Left Kidney: Length: 11.6 cm. Normal morphology without
hydronephrosis. Small cyst at lower pole 2.0 x 2.1 x 1.4 cm. No
additional mass.

Abdominal aorta: Normal caliber

Other findings: No free fluid
IMPRESSION: Suboptimal pancreatic visualization.

Small LEFT renal cyst.

Otherwise normal exam.

## 2016-03-28 DIAGNOSIS — J3089 Other allergic rhinitis: Secondary | ICD-10-CM | POA: Diagnosis not present

## 2016-03-28 DIAGNOSIS — J301 Allergic rhinitis due to pollen: Secondary | ICD-10-CM | POA: Diagnosis not present

## 2016-03-28 DIAGNOSIS — J3081 Allergic rhinitis due to animal (cat) (dog) hair and dander: Secondary | ICD-10-CM | POA: Diagnosis not present

## 2016-04-11 DIAGNOSIS — J3081 Allergic rhinitis due to animal (cat) (dog) hair and dander: Secondary | ICD-10-CM | POA: Diagnosis not present

## 2016-04-11 DIAGNOSIS — J3089 Other allergic rhinitis: Secondary | ICD-10-CM | POA: Diagnosis not present

## 2016-04-11 DIAGNOSIS — J301 Allergic rhinitis due to pollen: Secondary | ICD-10-CM | POA: Diagnosis not present

## 2016-04-30 ENCOUNTER — Other Ambulatory Visit: Payer: Self-pay | Admitting: Family Medicine

## 2016-04-30 NOTE — Telephone Encounter (Signed)
Call in #30 with 2 rf 

## 2016-05-23 DIAGNOSIS — J301 Allergic rhinitis due to pollen: Secondary | ICD-10-CM | POA: Diagnosis not present

## 2016-05-23 DIAGNOSIS — J3089 Other allergic rhinitis: Secondary | ICD-10-CM | POA: Diagnosis not present

## 2016-05-23 DIAGNOSIS — J3081 Allergic rhinitis due to animal (cat) (dog) hair and dander: Secondary | ICD-10-CM | POA: Diagnosis not present

## 2016-05-30 DIAGNOSIS — J3089 Other allergic rhinitis: Secondary | ICD-10-CM | POA: Diagnosis not present

## 2016-05-30 DIAGNOSIS — J3081 Allergic rhinitis due to animal (cat) (dog) hair and dander: Secondary | ICD-10-CM | POA: Diagnosis not present

## 2016-05-30 DIAGNOSIS — J301 Allergic rhinitis due to pollen: Secondary | ICD-10-CM | POA: Diagnosis not present

## 2016-06-13 DIAGNOSIS — J3081 Allergic rhinitis due to animal (cat) (dog) hair and dander: Secondary | ICD-10-CM | POA: Diagnosis not present

## 2016-06-13 DIAGNOSIS — J3089 Other allergic rhinitis: Secondary | ICD-10-CM | POA: Diagnosis not present

## 2016-06-13 DIAGNOSIS — J301 Allergic rhinitis due to pollen: Secondary | ICD-10-CM | POA: Diagnosis not present

## 2016-06-23 DIAGNOSIS — J3089 Other allergic rhinitis: Secondary | ICD-10-CM | POA: Diagnosis not present

## 2016-06-23 DIAGNOSIS — J3081 Allergic rhinitis due to animal (cat) (dog) hair and dander: Secondary | ICD-10-CM | POA: Diagnosis not present

## 2016-06-23 DIAGNOSIS — J301 Allergic rhinitis due to pollen: Secondary | ICD-10-CM | POA: Diagnosis not present

## 2016-06-27 DIAGNOSIS — J301 Allergic rhinitis due to pollen: Secondary | ICD-10-CM | POA: Diagnosis not present

## 2016-06-27 DIAGNOSIS — J3089 Other allergic rhinitis: Secondary | ICD-10-CM | POA: Diagnosis not present

## 2016-06-27 DIAGNOSIS — J3081 Allergic rhinitis due to animal (cat) (dog) hair and dander: Secondary | ICD-10-CM | POA: Diagnosis not present

## 2016-07-08 DIAGNOSIS — J3081 Allergic rhinitis due to animal (cat) (dog) hair and dander: Secondary | ICD-10-CM | POA: Diagnosis not present

## 2016-07-08 DIAGNOSIS — J3089 Other allergic rhinitis: Secondary | ICD-10-CM | POA: Diagnosis not present

## 2016-07-08 DIAGNOSIS — J301 Allergic rhinitis due to pollen: Secondary | ICD-10-CM | POA: Diagnosis not present

## 2016-07-17 DIAGNOSIS — J301 Allergic rhinitis due to pollen: Secondary | ICD-10-CM | POA: Diagnosis not present

## 2016-07-17 DIAGNOSIS — J3089 Other allergic rhinitis: Secondary | ICD-10-CM | POA: Diagnosis not present

## 2016-07-17 DIAGNOSIS — J3081 Allergic rhinitis due to animal (cat) (dog) hair and dander: Secondary | ICD-10-CM | POA: Diagnosis not present

## 2016-07-28 DIAGNOSIS — J3081 Allergic rhinitis due to animal (cat) (dog) hair and dander: Secondary | ICD-10-CM | POA: Diagnosis not present

## 2016-07-28 DIAGNOSIS — J3089 Other allergic rhinitis: Secondary | ICD-10-CM | POA: Diagnosis not present

## 2016-07-28 DIAGNOSIS — J301 Allergic rhinitis due to pollen: Secondary | ICD-10-CM | POA: Diagnosis not present

## 2016-08-01 DIAGNOSIS — J301 Allergic rhinitis due to pollen: Secondary | ICD-10-CM | POA: Diagnosis not present

## 2016-08-01 DIAGNOSIS — J3081 Allergic rhinitis due to animal (cat) (dog) hair and dander: Secondary | ICD-10-CM | POA: Diagnosis not present

## 2016-08-01 DIAGNOSIS — J3089 Other allergic rhinitis: Secondary | ICD-10-CM | POA: Diagnosis not present

## 2016-08-07 DIAGNOSIS — J3081 Allergic rhinitis due to animal (cat) (dog) hair and dander: Secondary | ICD-10-CM | POA: Diagnosis not present

## 2016-08-07 DIAGNOSIS — J3089 Other allergic rhinitis: Secondary | ICD-10-CM | POA: Diagnosis not present

## 2016-08-07 DIAGNOSIS — J301 Allergic rhinitis due to pollen: Secondary | ICD-10-CM | POA: Diagnosis not present

## 2016-08-13 DIAGNOSIS — Z86018 Personal history of other benign neoplasm: Secondary | ICD-10-CM | POA: Diagnosis not present

## 2016-08-13 DIAGNOSIS — D225 Melanocytic nevi of trunk: Secondary | ICD-10-CM | POA: Diagnosis not present

## 2016-08-13 DIAGNOSIS — D224 Melanocytic nevi of scalp and neck: Secondary | ICD-10-CM | POA: Diagnosis not present

## 2016-08-13 DIAGNOSIS — D2271 Melanocytic nevi of right lower limb, including hip: Secondary | ICD-10-CM | POA: Diagnosis not present

## 2016-08-15 DIAGNOSIS — J3081 Allergic rhinitis due to animal (cat) (dog) hair and dander: Secondary | ICD-10-CM | POA: Diagnosis not present

## 2016-08-15 DIAGNOSIS — J3089 Other allergic rhinitis: Secondary | ICD-10-CM | POA: Diagnosis not present

## 2016-08-15 DIAGNOSIS — J301 Allergic rhinitis due to pollen: Secondary | ICD-10-CM | POA: Diagnosis not present

## 2016-08-26 DIAGNOSIS — J3089 Other allergic rhinitis: Secondary | ICD-10-CM | POA: Diagnosis not present

## 2016-08-26 DIAGNOSIS — J3081 Allergic rhinitis due to animal (cat) (dog) hair and dander: Secondary | ICD-10-CM | POA: Diagnosis not present

## 2016-08-26 DIAGNOSIS — J301 Allergic rhinitis due to pollen: Secondary | ICD-10-CM | POA: Diagnosis not present

## 2016-09-12 DIAGNOSIS — J301 Allergic rhinitis due to pollen: Secondary | ICD-10-CM | POA: Diagnosis not present

## 2016-09-12 DIAGNOSIS — J3089 Other allergic rhinitis: Secondary | ICD-10-CM | POA: Diagnosis not present

## 2016-09-12 DIAGNOSIS — J3081 Allergic rhinitis due to animal (cat) (dog) hair and dander: Secondary | ICD-10-CM | POA: Diagnosis not present

## 2016-09-23 DIAGNOSIS — J3089 Other allergic rhinitis: Secondary | ICD-10-CM | POA: Diagnosis not present

## 2016-09-23 DIAGNOSIS — J3081 Allergic rhinitis due to animal (cat) (dog) hair and dander: Secondary | ICD-10-CM | POA: Diagnosis not present

## 2016-09-23 DIAGNOSIS — J301 Allergic rhinitis due to pollen: Secondary | ICD-10-CM | POA: Diagnosis not present

## 2016-10-02 DIAGNOSIS — J3089 Other allergic rhinitis: Secondary | ICD-10-CM | POA: Diagnosis not present

## 2016-10-02 DIAGNOSIS — J301 Allergic rhinitis due to pollen: Secondary | ICD-10-CM | POA: Diagnosis not present

## 2016-10-02 DIAGNOSIS — J3081 Allergic rhinitis due to animal (cat) (dog) hair and dander: Secondary | ICD-10-CM | POA: Diagnosis not present

## 2016-10-06 DIAGNOSIS — J301 Allergic rhinitis due to pollen: Secondary | ICD-10-CM | POA: Diagnosis not present

## 2016-10-06 DIAGNOSIS — J3089 Other allergic rhinitis: Secondary | ICD-10-CM | POA: Diagnosis not present

## 2016-10-06 DIAGNOSIS — J3081 Allergic rhinitis due to animal (cat) (dog) hair and dander: Secondary | ICD-10-CM | POA: Diagnosis not present

## 2016-10-13 ENCOUNTER — Ambulatory Visit: Payer: BLUE CROSS/BLUE SHIELD | Admitting: Family Medicine

## 2016-10-17 DIAGNOSIS — J301 Allergic rhinitis due to pollen: Secondary | ICD-10-CM | POA: Diagnosis not present

## 2016-10-17 DIAGNOSIS — J3089 Other allergic rhinitis: Secondary | ICD-10-CM | POA: Diagnosis not present

## 2016-10-17 DIAGNOSIS — J3081 Allergic rhinitis due to animal (cat) (dog) hair and dander: Secondary | ICD-10-CM | POA: Diagnosis not present

## 2016-10-27 DIAGNOSIS — J301 Allergic rhinitis due to pollen: Secondary | ICD-10-CM | POA: Diagnosis not present

## 2016-10-27 DIAGNOSIS — J3089 Other allergic rhinitis: Secondary | ICD-10-CM | POA: Diagnosis not present

## 2016-10-27 DIAGNOSIS — J3081 Allergic rhinitis due to animal (cat) (dog) hair and dander: Secondary | ICD-10-CM | POA: Diagnosis not present

## 2016-10-28 ENCOUNTER — Encounter: Payer: Self-pay | Admitting: Family Medicine

## 2016-10-28 ENCOUNTER — Ambulatory Visit (INDEPENDENT_AMBULATORY_CARE_PROVIDER_SITE_OTHER): Payer: BLUE CROSS/BLUE SHIELD | Admitting: Family Medicine

## 2016-10-28 VITALS — BP 134/88 | HR 61 | Temp 97.9°F | Ht 67.5 in | Wt 177.0 lb

## 2016-10-28 DIAGNOSIS — K219 Gastro-esophageal reflux disease without esophagitis: Secondary | ICD-10-CM | POA: Diagnosis not present

## 2016-10-28 NOTE — Patient Instructions (Signed)
WE NOW OFFER   [name] FAST TRACK!!!  SAME DAY Appointments for ACUTE CARE  Such as: Sprains, Injuries, cuts, abrasions, rashes, muscle pain, joint pain, back pain Colds, flu, sore throats, headache, allergies, cough, fever  Ear pain, sinus and eye infections Abdominal pain, nausea, vomiting, diarrhea, upset stomach Animal/insect bites  3 Easy Ways to Schedule: Walk-In Scheduling Call in scheduling Mychart Sign-up: [email or web address]         

## 2016-10-28 NOTE — Progress Notes (Signed)
   Subjective:    Patient ID: Brent Bennett, male    DOB: December 31, 1965, 51 y.o.   MRN: [id number]  HPI Here for one month of intermittent chest pressure and heartburn. No trouble swallowing or SOB or coughing. He also has some intermittent diarrhea when he is exercising, especially when running. No abdominal pain or fever.    Review of Systems  Constitutional: Negative.   Respiratory: Negative.   Cardiovascular: Positive for chest pain. Negative for palpitations and leg swelling.  Gastrointestinal: Positive for diarrhea. Negative for abdominal distention, abdominal pain, anal bleeding, blood in stool, constipation, nausea, rectal pain and vomiting.  Neurological: Negative.        Objective:   Physical Exam  Constitutional: He appears well-developed and well-nourished.  Cardiovascular: Normal rate, regular rhythm, normal heart sounds and intact distal pulses.   Pulmonary/Chest: Effort normal and breath sounds normal. No respiratory distress. He has no wheezes. He has no rales.  Abdominal: Soft. Bowel sounds are normal. He exhibits no distension. There is no tenderness. There is no rebound and no guarding.          Assessment & Plan:  This sounds like GERD. He will try taking Omeprazole 40 mg daily for a few weeks. Recheck prn.  Gershon CraneStephen Makenzey Nanni, MD

## 2016-10-29 DIAGNOSIS — J301 Allergic rhinitis due to pollen: Secondary | ICD-10-CM | POA: Diagnosis not present

## 2016-10-29 DIAGNOSIS — J3089 Other allergic rhinitis: Secondary | ICD-10-CM | POA: Diagnosis not present

## 2016-10-29 DIAGNOSIS — H1045 Other chronic allergic conjunctivitis: Secondary | ICD-10-CM | POA: Diagnosis not present

## 2016-10-29 DIAGNOSIS — J3081 Allergic rhinitis due to animal (cat) (dog) hair and dander: Secondary | ICD-10-CM | POA: Diagnosis not present

## 2016-10-30 DIAGNOSIS — J301 Allergic rhinitis due to pollen: Secondary | ICD-10-CM | POA: Diagnosis not present

## 2016-10-31 DIAGNOSIS — J3089 Other allergic rhinitis: Secondary | ICD-10-CM | POA: Diagnosis not present

## 2016-10-31 DIAGNOSIS — J3081 Allergic rhinitis due to animal (cat) (dog) hair and dander: Secondary | ICD-10-CM | POA: Diagnosis not present

## 2016-11-04 DIAGNOSIS — J301 Allergic rhinitis due to pollen: Secondary | ICD-10-CM | POA: Diagnosis not present

## 2016-11-04 DIAGNOSIS — J3089 Other allergic rhinitis: Secondary | ICD-10-CM | POA: Diagnosis not present

## 2016-11-04 DIAGNOSIS — J3081 Allergic rhinitis due to animal (cat) (dog) hair and dander: Secondary | ICD-10-CM | POA: Diagnosis not present

## 2016-11-05 ENCOUNTER — Encounter: Payer: Self-pay | Admitting: Family Medicine

## 2016-11-05 ENCOUNTER — Ambulatory Visit (INDEPENDENT_AMBULATORY_CARE_PROVIDER_SITE_OTHER): Payer: BLUE CROSS/BLUE SHIELD | Admitting: Family Medicine

## 2016-11-05 VITALS — BP 122/73 | HR 70 | Temp 99.0°F | Ht 67.5 in | Wt 176.0 lb

## 2016-11-05 DIAGNOSIS — K5792 Diverticulitis of intestine, part unspecified, without perforation or abscess without bleeding: Secondary | ICD-10-CM

## 2016-11-05 DIAGNOSIS — Z0000 Encounter for general adult medical examination without abnormal findings: Secondary | ICD-10-CM | POA: Diagnosis not present

## 2016-11-05 MED ORDER — METRONIDAZOLE 500 MG PO TABS
500.0000 mg | ORAL_TABLET | Freq: Three times a day (TID) | ORAL | 0 refills | Status: DC
Start: 2016-11-05 — End: 2017-01-12

## 2016-11-05 MED ORDER — CIPROFLOXACIN HCL 500 MG PO TABS: 500.0000 mg | ORAL_TABLET | Freq: Two times a day (BID) | ORAL | 0 refills | Status: DC

## 2016-11-05 NOTE — Patient Instructions (Signed)
WE NOW OFFER   [facility] FAST TRACK!!!  SAME DAY Appointments for ACUTE CARE  Such as: Sprains, Injuries, cuts, abrasions, rashes, muscle pain, joint pain, back pain Colds, flu, sore throats, headache, allergies, cough, fever  Ear pain, sinus and eye infections Abdominal pain, nausea, vomiting, diarrhea, upset stomach Animal/insect bites  3 Easy Ways to Schedule: Walk-In Scheduling Call in scheduling Mychart Sign-up: [email or web address]         

## 2016-11-05 NOTE — Progress Notes (Signed)
   Subjective:    Patient ID: Brent BlonderMichael A Cogbill, male    DOB: Aug 05, 1965, 51 y.o.   MRN: [id number]  HPI Here for 2 days of lower abdominal pain. No diarrhea, no nausea. He has had a low grade fever. No UTI sx. He says this feels just like the bout of diverticulitis he had last year. This was proven by CT scan at that time, and he responded well to Cipro and Flagyl.    Review of Systems  Constitutional: Positive for fever.  Respiratory: Negative.   Cardiovascular: Negative.   Gastrointestinal: Positive for abdominal pain. Negative for abdominal distention, anal bleeding, blood in stool, constipation, diarrhea, nausea, rectal pain and vomiting.  Genitourinary: Negative.        Objective:   Physical Exam  Constitutional: He appears well-developed and well-nourished.  Cardiovascular: Normal rate, regular rhythm, normal heart sounds and intact distal pulses.   Pulmonary/Chest: Effort normal and breath sounds normal.  Abdominal: Soft. Bowel sounds are normal. He exhibits no distension and no mass. There is no rebound and no guarding.  Tender in the LLQ           Assessment & Plan:  Diverticulitis, treat with Cipro and Flagyl. Once he recovers he should set up a colonoscopy.  Gershon CraneStephen Farrie Sann, MD

## 2016-11-06 ENCOUNTER — Encounter: Payer: Self-pay | Admitting: Gastroenterology

## 2016-11-12 DIAGNOSIS — R935 Abnormal findings on diagnostic imaging of other abdominal regions, including retroperitoneum: Secondary | ICD-10-CM | POA: Diagnosis not present

## 2016-11-12 DIAGNOSIS — R109 Unspecified abdominal pain: Secondary | ICD-10-CM | POA: Diagnosis not present

## 2016-11-12 DIAGNOSIS — R195 Other fecal abnormalities: Secondary | ICD-10-CM | POA: Diagnosis not present

## 2016-11-18 DIAGNOSIS — J3081 Allergic rhinitis due to animal (cat) (dog) hair and dander: Secondary | ICD-10-CM | POA: Diagnosis not present

## 2016-11-18 DIAGNOSIS — J3089 Other allergic rhinitis: Secondary | ICD-10-CM | POA: Diagnosis not present

## 2016-11-18 DIAGNOSIS — J301 Allergic rhinitis due to pollen: Secondary | ICD-10-CM | POA: Diagnosis not present

## 2016-11-28 DIAGNOSIS — J3089 Other allergic rhinitis: Secondary | ICD-10-CM | POA: Diagnosis not present

## 2016-11-28 DIAGNOSIS — J301 Allergic rhinitis due to pollen: Secondary | ICD-10-CM | POA: Diagnosis not present

## 2016-11-28 DIAGNOSIS — J3081 Allergic rhinitis due to animal (cat) (dog) hair and dander: Secondary | ICD-10-CM | POA: Diagnosis not present

## 2016-12-04 DIAGNOSIS — J3089 Other allergic rhinitis: Secondary | ICD-10-CM | POA: Diagnosis not present

## 2016-12-04 DIAGNOSIS — J3081 Allergic rhinitis due to animal (cat) (dog) hair and dander: Secondary | ICD-10-CM | POA: Diagnosis not present

## 2016-12-04 DIAGNOSIS — J301 Allergic rhinitis due to pollen: Secondary | ICD-10-CM | POA: Diagnosis not present

## 2016-12-12 DIAGNOSIS — J3081 Allergic rhinitis due to animal (cat) (dog) hair and dander: Secondary | ICD-10-CM | POA: Diagnosis not present

## 2016-12-12 DIAGNOSIS — J3089 Other allergic rhinitis: Secondary | ICD-10-CM | POA: Diagnosis not present

## 2016-12-12 DIAGNOSIS — J301 Allergic rhinitis due to pollen: Secondary | ICD-10-CM | POA: Diagnosis not present

## 2017-01-01 DIAGNOSIS — J3081 Allergic rhinitis due to animal (cat) (dog) hair and dander: Secondary | ICD-10-CM | POA: Diagnosis not present

## 2017-01-01 DIAGNOSIS — J301 Allergic rhinitis due to pollen: Secondary | ICD-10-CM | POA: Diagnosis not present

## 2017-01-01 DIAGNOSIS — J3089 Other allergic rhinitis: Secondary | ICD-10-CM | POA: Diagnosis not present

## 2017-01-12 ENCOUNTER — Ambulatory Visit (AMBULATORY_SURGERY_CENTER): Payer: Self-pay | Admitting: *Deleted

## 2017-01-12 VITALS — Ht 69.0 in | Wt 176.0 lb

## 2017-01-12 DIAGNOSIS — Z1211 Encounter for screening for malignant neoplasm of colon: Secondary | ICD-10-CM

## 2017-01-12 MED ORDER — NA SULFATE-K SULFATE-MG SULF 17.5-3.13-1.6 GM/177ML PO SOLN: ORAL | 0 refills | Status: DC

## 2017-01-12 NOTE — Progress Notes (Signed)
Patient denies any allergies to eggs or soy. Patient denies any problems with anesthesia/sedation. Patient denies any oxygen use at home. Patient denies taking any diet/weight loss medications or blood thinners. EMMI education assisgned to patient on colonoscopy, this was explained and instructions given to patient. 

## 2017-01-19 ENCOUNTER — Encounter: Payer: Self-pay | Admitting: Gastroenterology

## 2017-01-23 DIAGNOSIS — J301 Allergic rhinitis due to pollen: Secondary | ICD-10-CM | POA: Diagnosis not present

## 2017-01-23 DIAGNOSIS — J3089 Other allergic rhinitis: Secondary | ICD-10-CM | POA: Diagnosis not present

## 2017-01-23 DIAGNOSIS — J3081 Allergic rhinitis due to animal (cat) (dog) hair and dander: Secondary | ICD-10-CM | POA: Diagnosis not present

## 2017-01-29 ENCOUNTER — Encounter: Payer: BLUE CROSS/BLUE SHIELD | Admitting: Family Medicine

## 2017-02-02 ENCOUNTER — Encounter: Payer: Self-pay | Admitting: Gastroenterology

## 2017-02-02 ENCOUNTER — Ambulatory Visit (AMBULATORY_SURGERY_CENTER): Payer: BLUE CROSS/BLUE SHIELD | Admitting: Gastroenterology

## 2017-02-02 VITALS — BP 110/72 | HR 56 | Temp 98.6°F | Resp 13 | Ht 69.0 in | Wt 176.0 lb

## 2017-02-02 DIAGNOSIS — Z8719 Personal history of other diseases of the digestive system: Secondary | ICD-10-CM | POA: Diagnosis present

## 2017-02-02 DIAGNOSIS — K5730 Diverticulosis of large intestine without perforation or abscess without bleeding: Secondary | ICD-10-CM | POA: Diagnosis not present

## 2017-02-02 DIAGNOSIS — Z1211 Encounter for screening for malignant neoplasm of colon: Secondary | ICD-10-CM | POA: Diagnosis not present

## 2017-02-02 HISTORY — PX: COLONOSCOPY: SHX174

## 2017-02-02 MED ORDER — SODIUM CHLORIDE 0.9 % IV SOLN: 500.0000 mL | INTRAVENOUS | Status: DC

## 2017-02-02 NOTE — Progress Notes (Signed)
Report to PACU, RN, vss, BBS= Clear.  

## 2017-02-02 NOTE — Progress Notes (Signed)
Pt's states no medical or surgical changes since previsit or office visit. 

## 2017-02-02 NOTE — Op Note (Signed)
[facility] Patient Name: Brent Bennett Procedure Date: 02/02/2017 10:04 AM MRN: [id number] Endoscopist: Viviann Spare P. Hazeline Charnley MD, MD Age: 51 Referring MD:  Date of Birth: 1965-07-21 Gender: Male Account #: [id number] Procedure:                Colonoscopy Indications:              This is the patient's first colonoscopy, Abnormal                            CT of the GI tract with history of sigmoid                            diverticulitis x 2 suspected episodes Medicines:                Monitored Anesthesia Care Procedure:                Pre-Anesthesia Assessment:                           - Prior to the procedure, a History and Physical                            was performed, and patient medications and                            allergies were reviewed. The patient's tolerance of                            previous anesthesia was also reviewed. The risks                            and benefits of the procedure and the sedation                            options and risks were discussed with the patient.                            All questions were answered, and informed consent                            was obtained. Prior Anticoagulants: The patient has                            taken no previous anticoagulant or antiplatelet                            agents. ASA Grade Assessment: II - A patient with                            mild systemic disease. After reviewing the risks                            and benefits, the patient was deemed in  satisfactory condition to undergo the procedure.                           After obtaining informed consent, the colonoscope                            was passed under direct vision. Throughout the                            procedure, the patient's blood pressure, pulse, and                            oxygen saturations were monitored continuously. The                            Colonoscope was  introduced through the anus and                            advanced to the the terminal ileum, with                            identification of the appendiceal orifice and IC                            valve. The colonoscopy was performed without                            difficulty. The patient tolerated the procedure                            well. The quality of the bowel preparation was                            good. The terminal ileum, ileocecal valve,                            appendiceal orifice, and rectum were photographed. Scope In: 10:15:46 AM Scope Out: 10:28:15 AM Scope Withdrawal Time: 0 hours 9 minutes 48 seconds  Total Procedure Duration: 0 hours 12 minutes 29 seconds  Findings:                 The perianal and digital rectal examinations were                            normal.                           Multiple medium-mouthed diverticula were found in                            the transverse colon and left colon.                           The terminal ileum appeared normal.  The exam was otherwise without abnormality on                            direct and retroflexion views. No polyps Complications:            No immediate complications. Estimated blood loss:                            None. Estimated Blood Loss:     Estimated blood loss: none. Impression:               - Diverticulosis in the transverse colon and in the                            left colon which correlates with prior diagnosis of                            diverticulitis.                           - The examined portion of the ileum was normal.                           - The examination was otherwise normal on direct                            and retroflexion views.                           - No polyps Recommendation:           - Patient has a contact number available for                            emergencies. The signs and symptoms of potential                             delayed complications were discussed with the                            patient. Return to normal activities tomorrow.                            Written discharge instructions were provided to the                            patient.                           - High fiber diet given history of diverticulitis.                           - Continue present medications.                           - Repeat colonoscopy in 10 years for screening  purposes.                           - Call with any recurrence of diverticulitis                            symptoms Willaim RayasSteven P. Aithana Kushner MD, MD 02/02/2017 10:34:06 AM This report has been signed electronically.

## 2017-02-02 NOTE — Patient Instructions (Signed)
Diverticulosis seen today. Handout given on diverticulosis and high fiber diet. Repeat a colonoscopy in 10 years!! Call us with any questions or concerns. Thank you!  YOU HAD AN ENDOSCOPIC PROCEDURE TODAY AT THE [facility]:   Refer to the procedure report that was given to you for any specific questions about what was found during the examination.  If the procedure report does not answer your questions, please call your gastroenterologist to clarify.  If you requested that your care partner not be given the details of your procedure findings, then the procedure report has been included in a sealed envelope for you to review at your convenience later.  YOU SHOULD EXPECT: Some feelings of bloating in the abdomen. Passage of more gas than usual.  Walking can help get rid of the air that was put into your GI tract during the procedure and reduce the bloating. If you had a lower endoscopy (such as a colonoscopy or flexible sigmoidoscopy) you may notice spotting of blood in your stool or on the toilet paper. If you underwent a bowel prep for your procedure, you may not have a normal bowel movement for a few days.  Please Note:  You might notice some irritation and congestion in your nose or some drainage.  This is from the oxygen used during your procedure.  There is no need for concern and it should clear up in a day or so.  SYMPTOMS TO REPORT IMMEDIATELY:   Following lower endoscopy (colonoscopy or flexible sigmoidoscopy):  Excessive amounts of blood in the stool  Significant tenderness or worsening of abdominal pains  Swelling of the abdomen that is new, acute  Fever of 100F or higher  For urgent or emergent issues, a gastroenterologist can be reached at any hour by calling [phone].   DIET:  We do recommend a small meal at first, but then you may proceed to your regular diet.  Drink plenty of fluids but you should avoid alcoholic beverages for 24 hours.  ACTIVITY:  You  should plan to take it easy for the rest of today and you should NOT DRIVE or use heavy machinery until tomorrow (because of the sedation medicines used during the test).    FOLLOW UP: Our staff will call the number listed on your records the next business day following your procedure to check on you and address any questions or concerns that you may have regarding the information given to you following your procedure. If we do not reach you, we will leave a message.  However, if you are feeling well and you are not experiencing any problems, there is no need to return our call.  We will assume that you have returned to your regular daily activities without incident.  If any biopsies were taken you will be contacted by phone or by letter within the next 1-3 weeks.  Please call us at [phone] if you have not heard about the biopsies in 3 weeks.    SIGNATURES/CONFIDENTIALITY: You and/or your care partner have signed paperwork which will be entered into your electronic medical record.  These signatures attest to the fact that that the information above on your After Visit Summary has been reviewed and is understood.  Full responsibility of the confidentiality of this discharge information lies with you and/or your care-partner.

## 2017-02-03 ENCOUNTER — Telehealth: Payer: Self-pay | Admitting: *Deleted

## 2017-02-03 NOTE — Telephone Encounter (Signed)
  Follow up Call-  Call back number 02/02/2017  Post procedure Call Back phone  # [phone]  Permission to leave phone message Yes  Some recent data might be hidden     Patient questions:  Do you have a fever, pain , or abdominal swelling? No. Pain Score  0 *  Have you tolerated food without any problems? Yes.    Have you been able to return to your normal activities? Yes.    Do you have any questions about your discharge instructions: Diet   No. Medications  No. Follow up visit  No.  Do you have questions or concerns about your Care? No.  Actions: * If pain score is 4 or above: No action needed, pain <4.

## 2017-02-17 ENCOUNTER — Encounter: Payer: BLUE CROSS/BLUE SHIELD | Admitting: Family Medicine

## 2017-03-02 ENCOUNTER — Encounter: Payer: Self-pay | Admitting: Family Medicine

## 2017-03-02 ENCOUNTER — Ambulatory Visit (INDEPENDENT_AMBULATORY_CARE_PROVIDER_SITE_OTHER): Payer: BLUE CROSS/BLUE SHIELD | Admitting: Family Medicine

## 2017-03-02 VITALS — BP 122/80 | HR 69 | Temp 97.9°F | Ht 68.0 in | Wt 175.6 lb

## 2017-03-02 DIAGNOSIS — Z125 Encounter for screening for malignant neoplasm of prostate: Secondary | ICD-10-CM

## 2017-03-02 DIAGNOSIS — Z23 Encounter for immunization: Secondary | ICD-10-CM | POA: Diagnosis not present

## 2017-03-02 DIAGNOSIS — Z0000 Encounter for general adult medical examination without abnormal findings: Secondary | ICD-10-CM

## 2017-03-02 LAB — LIPID PANEL
CHOLESTEROL: 268 mg/dL — AB (ref 0–200)
HDL: 57 mg/dL (ref >39.00)
LDL Cholesterol: 179 mg/dL — ABNORMAL HIGH (ref 0–99)
NonHDL: 211.19
TRIGLYCERIDES: 159 mg/dL — AB (ref 0.0–149.0)
Total CHOL/HDL Ratio: 5
VLDL: 31.8 mg/dL (ref 0.0–40.0)

## 2017-03-02 LAB — BASIC METABOLIC PANEL
BUN: 22 mg/dL (ref 6–23)
CALCIUM: 9.4 mg/dL (ref 8.4–10.5)
CO2: 29 meq/L (ref 19–32)
Chloride: 103 mEq/L (ref 96–112)
Creatinine, Ser: 0.94 mg/dL (ref 0.40–1.50)
GFR: 89.8 mL/min (ref >60.00)
Glucose, Bld: 104 mg/dL — ABNORMAL HIGH (ref 70–99)
POTASSIUM: 4 meq/L (ref 3.5–5.1)
SODIUM: 140 meq/L (ref 135–145)

## 2017-03-02 LAB — CBC WITH DIFFERENTIAL/PLATELET
BASOS ABS: 0 10*3/uL (ref 0.0–0.1)
BASOS PCT: 0.6 % (ref 0.0–3.0)
EOS ABS: 0.1 10*3/uL (ref 0.0–0.7)
Eosinophils Relative: 2.7 % (ref 0.0–5.0)
HEMATOCRIT: 47.3 % (ref 39.0–52.0)
Hemoglobin: 15.8 g/dL (ref 13.0–17.0)
LYMPHS ABS: 1.3 10*3/uL (ref 0.7–4.0)
Lymphocytes Relative: 28.8 % (ref 12.0–46.0)
MCHC: 33.4 g/dL (ref 30.0–36.0)
MCV: 92 fl (ref 78.0–100.0)
MONO ABS: 0.3 10*3/uL (ref 0.1–1.0)
Monocytes Relative: 6.7 % (ref 3.0–12.0)
NEUTROS ABS: 2.7 10*3/uL (ref 1.4–7.7)
NEUTROS PCT: 61.2 % (ref 43.0–77.0)
PLATELETS: 201 10*3/uL (ref 150.0–400.0)
RBC: 5.14 Mil/uL (ref 4.22–5.81)
RDW: 12.7 % (ref 11.5–15.5)
WBC: 4.4 10*3/uL (ref 4.0–10.5)

## 2017-03-02 LAB — PSA: PSA: 1.58 ng/mL (ref 0.10–4.00)

## 2017-03-02 LAB — POC URINALSYSI DIPSTICK (AUTOMATED)
BILIRUBIN UA: NEGATIVE
GLUCOSE UA: NEGATIVE
KETONES UA: NEGATIVE
Leukocytes, UA: NEGATIVE
NITRITE UA: NEGATIVE
PH UA: 5.5 (ref 5.0–8.0)
Protein, UA: NEGATIVE
RBC UA: NEGATIVE
SPEC GRAV UA: 1.025 (ref 1.010–1.025)
Urobilinogen, UA: 0.2 E.U./dL

## 2017-03-02 LAB — HEPATIC FUNCTION PANEL
ALBUMIN: 4.3 g/dL (ref 3.5–5.2)
ALK PHOS: 57 U/L (ref 39–117)
ALT: 23 U/L (ref 0–53)
AST: 24 U/L (ref 0–37)
Bilirubin, Direct: 0.2 mg/dL (ref 0.0–0.3)
TOTAL PROTEIN: 7.1 g/dL (ref 6.0–8.3)
Total Bilirubin: 1.5 mg/dL — ABNORMAL HIGH (ref 0.2–1.2)

## 2017-03-02 LAB — TSH: TSH: 1.88 u[IU]/mL (ref 0.35–4.50)

## 2017-03-02 MED ORDER — ZOLPIDEM TARTRATE 10 MG PO TABS: ORAL_TABLET | ORAL | 5 refills | Status: DC

## 2017-03-02 NOTE — Progress Notes (Signed)
   Subjective:    Patient ID: Brent Bennett, male    DOB: 04-Nov-1965, 51 y.o.   MRN: [id number]  HPI Here for a well exam. He is doing well.    Review of Systems  Constitutional: Negative.   HENT: Negative.   Eyes: Negative.   Respiratory: Negative.   Cardiovascular: Negative.   Gastrointestinal: Negative.   Genitourinary: Negative.   Musculoskeletal: Negative.   Skin: Negative.   Neurological: Negative.   Psychiatric/Behavioral: Negative.        Objective:   Physical Exam  Constitutional: He is oriented to person, place, and time. He appears well-developed and well-nourished. No distress.  HENT:  Head: Normocephalic and atraumatic.  Right Ear: External ear normal.  Left Ear: External ear normal.  Nose: Nose normal.  Mouth/Throat: Oropharynx is clear and moist. No oropharyngeal exudate.  Eyes: Conjunctivae and EOM are normal. Pupils are equal, round, and reactive to light. Right eye exhibits no discharge. Left eye exhibits no discharge. No scleral icterus.  Neck: Neck supple. No JVD present. No tracheal deviation present. No thyromegaly present.  Cardiovascular: Normal rate, regular rhythm, normal heart sounds and intact distal pulses. Exam reveals no gallop and no friction rub.  No murmur heard. Pulmonary/Chest: Effort normal and breath sounds normal. No respiratory distress. He has no wheezes. He has no rales. He exhibits no tenderness.  Abdominal: Soft. Bowel sounds are normal. He exhibits no distension and no mass. There is no tenderness. There is no rebound and no guarding.  Genitourinary: Rectum normal, prostate normal and penis normal. Rectal exam shows guaiac negative stool. No penile tenderness.  Musculoskeletal: Normal range of motion. He exhibits no edema or tenderness.  Lymphadenopathy:    He has no cervical adenopathy.  Neurological: He is alert and oriented to person, place, and time. He has normal reflexes. No cranial nerve deficit. He exhibits normal  muscle tone. Coordination normal.  Skin: Skin is warm and dry. No rash noted. He is not diaphoretic. No erythema. No pallor.  Psychiatric: He has a normal mood and affect. His behavior is normal. Judgment and thought content normal.          Assessment & Plan:  Well exam. We discussed diet and exercise. Get fasting labs. Gershon CraneStephen Lamiya Naas, MD

## 2017-03-02 NOTE — Addendum Note (Signed)
Addended by: Gracelyn NurseBLACKWELL, Marletta Bousquet P on: 03/02/2017 01:42 PM   Modules accepted: Orders

## 2017-03-20 ENCOUNTER — Encounter: Payer: Self-pay | Admitting: Family Medicine

## 2017-03-20 ENCOUNTER — Ambulatory Visit (INDEPENDENT_AMBULATORY_CARE_PROVIDER_SITE_OTHER): Payer: BLUE CROSS/BLUE SHIELD | Admitting: Family Medicine

## 2017-03-20 VITALS — Temp 98.2°F | Ht 68.0 in | Wt 177.0 lb

## 2017-03-20 DIAGNOSIS — J018 Other acute sinusitis: Secondary | ICD-10-CM

## 2017-03-20 DIAGNOSIS — E785 Hyperlipidemia, unspecified: Secondary | ICD-10-CM | POA: Diagnosis not present

## 2017-03-20 MED ORDER — AZITHROMYCIN 250 MG PO TABS
ORAL_TABLET | ORAL | 0 refills | Status: DC
Start: 2017-03-20 — End: 2017-04-22

## 2017-03-20 MED ORDER — METHYLPREDNISOLONE ACETATE 40 MG/ML IJ SUSP
120.0000 mg | Freq: Once | INTRAMUSCULAR | Status: AC
  Administered 2017-03-20: 120 mg via INTRAMUSCULAR

## 2017-03-20 NOTE — Progress Notes (Signed)
   Subjective:    Patient ID: Brent Bennett, male    DOB: [birth year], 51 y.o.   MRN: [id number]  HPI Here for 3 days of right ear pain, sinus pressure, PND, and ST. No cough.    Review of Systems  Constitutional: Negative.   HENT: Positive for congestion, ear pain, postnasal drip, sinus pressure, sinus pain and sore throat.   Eyes: Negative.   Respiratory: Negative.        Objective:   Physical Exam  Constitutional: He appears well-developed and well-nourished.  HENT:  Right Ear: External ear normal.  Left Ear: External ear normal.  Nose: Nose normal.  Mouth/Throat: Oropharynx is clear and moist.  Eyes: Conjunctivae are normal.  Neck: No thyromegaly present.  Pulmonary/Chest: Effort normal and breath sounds normal. No respiratory distress. He has no wheezes. He has no rales.  Lymphadenopathy:    He has no cervical adenopathy.          Assessment & Plan:  Sinusitis, treat with a steroid shot and a Zpack. Gershon CraneStephen Fry, MD

## 2017-04-22 ENCOUNTER — Telehealth: Payer: Self-pay | Admitting: Family Medicine

## 2017-04-22 MED ORDER — AZITHROMYCIN 250 MG PO TABS
ORAL_TABLET | ORAL | 0 refills | Status: DC
Start: 2017-04-22 — End: 2017-06-10

## 2017-04-22 NOTE — Telephone Encounter (Signed)
For a sinus infection  

## 2017-06-08 ENCOUNTER — Telehealth: Payer: Self-pay | Admitting: Family Medicine

## 2017-06-08 NOTE — Telephone Encounter (Signed)
Pt was in the office on 03/02/17 and left the paper script on the front counter it was destroyed by SomaliaSylvia and myself 06/08/17

## 2017-06-10 ENCOUNTER — Encounter: Payer: Self-pay | Admitting: Family Medicine

## 2017-06-10 ENCOUNTER — Ambulatory Visit (INDEPENDENT_AMBULATORY_CARE_PROVIDER_SITE_OTHER): Payer: BLUE CROSS/BLUE SHIELD | Admitting: Family Medicine

## 2017-06-10 VITALS — BP 110/80 | HR 69 | Temp 97.8°F | Wt 177.2 lb

## 2017-06-10 DIAGNOSIS — R059 Cough, unspecified: Secondary | ICD-10-CM

## 2017-06-10 DIAGNOSIS — R05 Cough: Secondary | ICD-10-CM | POA: Diagnosis not present

## 2017-06-10 DIAGNOSIS — J018 Other acute sinusitis: Secondary | ICD-10-CM

## 2017-06-10 MED ORDER — LEVOFLOXACIN 500 MG PO TABS
500.0000 mg | ORAL_TABLET | Freq: Every day | ORAL | 0 refills | Status: AC
Start: 2017-06-10 — End: 2017-06-20

## 2017-06-10 MED ORDER — METHYLPREDNISOLONE ACETATE 80 MG/ML IJ SUSP
120.0000 mg | Freq: Once | INTRAMUSCULAR | Status: AC
  Administered 2017-06-10: 120 mg via INTRAMUSCULAR

## 2017-06-10 NOTE — Addendum Note (Signed)
Addended by: Gracelyn NurseBLACKWELL, Liya Strollo P on: 06/10/2017 08:50 AM   Modules accepted: Orders

## 2017-06-10 NOTE — Progress Notes (Signed)
   Subjective:    Patient ID: Brent Bennett, male    DOB: 1965-08-20, 52 y.o.   MRN: [id number]  HPI Here for one month of sinus pressure, PND, and a dry cough. No fever. Using Flonase sprays.    Review of Systems  Constitutional: Negative.   HENT: Positive for congestion, postnasal drip, sinus pressure and sinus pain. Negative for sore throat.   Eyes: Negative.   Respiratory: Positive for cough.        Objective:   Physical Exam  Constitutional: He appears well-developed and well-nourished.  HENT:  Right Ear: External ear normal.  Left Ear: External ear normal.  Nose: Nose normal.  Mouth/Throat: Oropharynx is clear and moist.  Eyes: Conjunctivae are normal.  Neck: No thyromegaly present.  Pulmonary/Chest: Effort normal and breath sounds normal. No respiratory distress. He has no wheezes. He has no rales.  Lymphadenopathy:    He has no cervical adenopathy.          Assessment & Plan:  Sinusitis, treat with Levaquin. Given a steroid shot. Set up a CXR. Get back on Allegra D daily.  Gershon CraneStephen Fry, MD

## 2017-06-15 DIAGNOSIS — J3081 Allergic rhinitis due to animal (cat) (dog) hair and dander: Secondary | ICD-10-CM | POA: Diagnosis not present

## 2017-06-15 DIAGNOSIS — J301 Allergic rhinitis due to pollen: Secondary | ICD-10-CM | POA: Diagnosis not present

## 2017-06-15 DIAGNOSIS — J3089 Other allergic rhinitis: Secondary | ICD-10-CM | POA: Diagnosis not present

## 2017-06-18 NOTE — Telephone Encounter (Signed)
Pt calling for refill on Remus Lofflerambien - he is out of medication - per med history the RX was from 03/02/17 and pt left at front counter per this note   [id number] [place] - [address] AT [facility] [address] [facility] [address] [phone] (Phone) [phone])

## 2017-06-19 MED ORDER — ZOLPIDEM TARTRATE 10 MG PO TABS: ORAL_TABLET | ORAL | 0 refills | Status: DC

## 2017-06-19 NOTE — Addendum Note (Signed)
Addended by: Gracelyn NurseBLACKWELL, SHELBY P on: 06/19/2017 08:28 AM   Modules accepted: Orders

## 2017-06-19 NOTE — Telephone Encounter (Signed)
Called Rx into pharmacy for 1 months worth disp 10 with no refills. Called pt and left a VM that this has been done.

## 2017-07-02 DIAGNOSIS — J3089 Other allergic rhinitis: Secondary | ICD-10-CM | POA: Diagnosis not present

## 2017-07-02 DIAGNOSIS — J301 Allergic rhinitis due to pollen: Secondary | ICD-10-CM | POA: Diagnosis not present

## 2017-07-02 DIAGNOSIS — J3081 Allergic rhinitis due to animal (cat) (dog) hair and dander: Secondary | ICD-10-CM | POA: Diagnosis not present

## 2017-07-06 DIAGNOSIS — J301 Allergic rhinitis due to pollen: Secondary | ICD-10-CM | POA: Diagnosis not present

## 2017-07-06 DIAGNOSIS — J3089 Other allergic rhinitis: Secondary | ICD-10-CM | POA: Diagnosis not present

## 2017-07-06 DIAGNOSIS — J3081 Allergic rhinitis due to animal (cat) (dog) hair and dander: Secondary | ICD-10-CM | POA: Diagnosis not present

## 2017-07-09 DIAGNOSIS — J301 Allergic rhinitis due to pollen: Secondary | ICD-10-CM | POA: Diagnosis not present

## 2017-07-09 DIAGNOSIS — J3081 Allergic rhinitis due to animal (cat) (dog) hair and dander: Secondary | ICD-10-CM | POA: Diagnosis not present

## 2017-07-09 DIAGNOSIS — J3089 Other allergic rhinitis: Secondary | ICD-10-CM | POA: Diagnosis not present

## 2017-07-16 DIAGNOSIS — J3081 Allergic rhinitis due to animal (cat) (dog) hair and dander: Secondary | ICD-10-CM | POA: Diagnosis not present

## 2017-07-16 DIAGNOSIS — J301 Allergic rhinitis due to pollen: Secondary | ICD-10-CM | POA: Diagnosis not present

## 2017-07-16 DIAGNOSIS — J3089 Other allergic rhinitis: Secondary | ICD-10-CM | POA: Diagnosis not present

## 2017-07-21 DIAGNOSIS — J301 Allergic rhinitis due to pollen: Secondary | ICD-10-CM | POA: Diagnosis not present

## 2017-07-21 DIAGNOSIS — J3089 Other allergic rhinitis: Secondary | ICD-10-CM | POA: Diagnosis not present

## 2017-07-21 DIAGNOSIS — J3081 Allergic rhinitis due to animal (cat) (dog) hair and dander: Secondary | ICD-10-CM | POA: Diagnosis not present

## 2017-07-23 DIAGNOSIS — J301 Allergic rhinitis due to pollen: Secondary | ICD-10-CM | POA: Diagnosis not present

## 2017-07-23 DIAGNOSIS — J3089 Other allergic rhinitis: Secondary | ICD-10-CM | POA: Diagnosis not present

## 2017-07-23 DIAGNOSIS — J3081 Allergic rhinitis due to animal (cat) (dog) hair and dander: Secondary | ICD-10-CM | POA: Diagnosis not present

## 2017-07-29 DIAGNOSIS — J301 Allergic rhinitis due to pollen: Secondary | ICD-10-CM | POA: Diagnosis not present

## 2017-07-29 DIAGNOSIS — J3081 Allergic rhinitis due to animal (cat) (dog) hair and dander: Secondary | ICD-10-CM | POA: Diagnosis not present

## 2017-07-29 DIAGNOSIS — J3089 Other allergic rhinitis: Secondary | ICD-10-CM | POA: Diagnosis not present

## 2017-08-03 DIAGNOSIS — J3089 Other allergic rhinitis: Secondary | ICD-10-CM | POA: Diagnosis not present

## 2017-08-03 DIAGNOSIS — J3081 Allergic rhinitis due to animal (cat) (dog) hair and dander: Secondary | ICD-10-CM | POA: Diagnosis not present

## 2017-08-03 DIAGNOSIS — J301 Allergic rhinitis due to pollen: Secondary | ICD-10-CM | POA: Diagnosis not present

## 2017-08-06 DIAGNOSIS — J3089 Other allergic rhinitis: Secondary | ICD-10-CM | POA: Diagnosis not present

## 2017-08-06 DIAGNOSIS — J3081 Allergic rhinitis due to animal (cat) (dog) hair and dander: Secondary | ICD-10-CM | POA: Diagnosis not present

## 2017-08-06 DIAGNOSIS — J301 Allergic rhinitis due to pollen: Secondary | ICD-10-CM | POA: Diagnosis not present

## 2017-08-11 DIAGNOSIS — J3089 Other allergic rhinitis: Secondary | ICD-10-CM | POA: Diagnosis not present

## 2017-08-11 DIAGNOSIS — J301 Allergic rhinitis due to pollen: Secondary | ICD-10-CM | POA: Diagnosis not present

## 2017-08-11 DIAGNOSIS — J3081 Allergic rhinitis due to animal (cat) (dog) hair and dander: Secondary | ICD-10-CM | POA: Diagnosis not present

## 2017-08-17 DIAGNOSIS — J3089 Other allergic rhinitis: Secondary | ICD-10-CM | POA: Diagnosis not present

## 2017-08-17 DIAGNOSIS — J301 Allergic rhinitis due to pollen: Secondary | ICD-10-CM | POA: Diagnosis not present

## 2017-08-17 DIAGNOSIS — J3081 Allergic rhinitis due to animal (cat) (dog) hair and dander: Secondary | ICD-10-CM | POA: Diagnosis not present

## 2017-08-20 DIAGNOSIS — D224 Melanocytic nevi of scalp and neck: Secondary | ICD-10-CM | POA: Diagnosis not present

## 2017-08-20 DIAGNOSIS — D2271 Melanocytic nevi of right lower limb, including hip: Secondary | ICD-10-CM | POA: Diagnosis not present

## 2017-08-20 DIAGNOSIS — D225 Melanocytic nevi of trunk: Secondary | ICD-10-CM | POA: Diagnosis not present

## 2017-08-20 DIAGNOSIS — Z86018 Personal history of other benign neoplasm: Secondary | ICD-10-CM | POA: Diagnosis not present

## 2017-08-25 DIAGNOSIS — J3081 Allergic rhinitis due to animal (cat) (dog) hair and dander: Secondary | ICD-10-CM | POA: Diagnosis not present

## 2017-08-25 DIAGNOSIS — J301 Allergic rhinitis due to pollen: Secondary | ICD-10-CM | POA: Diagnosis not present

## 2017-08-25 DIAGNOSIS — J3089 Other allergic rhinitis: Secondary | ICD-10-CM | POA: Diagnosis not present

## 2017-08-28 DIAGNOSIS — J3081 Allergic rhinitis due to animal (cat) (dog) hair and dander: Secondary | ICD-10-CM | POA: Diagnosis not present

## 2017-08-28 DIAGNOSIS — J3089 Other allergic rhinitis: Secondary | ICD-10-CM | POA: Diagnosis not present

## 2017-08-28 DIAGNOSIS — J301 Allergic rhinitis due to pollen: Secondary | ICD-10-CM | POA: Diagnosis not present

## 2017-09-02 DIAGNOSIS — J301 Allergic rhinitis due to pollen: Secondary | ICD-10-CM | POA: Diagnosis not present

## 2017-09-02 DIAGNOSIS — J3089 Other allergic rhinitis: Secondary | ICD-10-CM | POA: Diagnosis not present

## 2017-09-02 DIAGNOSIS — J3081 Allergic rhinitis due to animal (cat) (dog) hair and dander: Secondary | ICD-10-CM | POA: Diagnosis not present

## 2017-09-11 DIAGNOSIS — J301 Allergic rhinitis due to pollen: Secondary | ICD-10-CM | POA: Diagnosis not present

## 2017-09-11 DIAGNOSIS — J3089 Other allergic rhinitis: Secondary | ICD-10-CM | POA: Diagnosis not present

## 2017-09-11 DIAGNOSIS — J3081 Allergic rhinitis due to animal (cat) (dog) hair and dander: Secondary | ICD-10-CM | POA: Diagnosis not present

## 2017-09-18 DIAGNOSIS — J301 Allergic rhinitis due to pollen: Secondary | ICD-10-CM | POA: Diagnosis not present

## 2017-09-18 DIAGNOSIS — J3081 Allergic rhinitis due to animal (cat) (dog) hair and dander: Secondary | ICD-10-CM | POA: Diagnosis not present

## 2017-09-18 DIAGNOSIS — J3089 Other allergic rhinitis: Secondary | ICD-10-CM | POA: Diagnosis not present

## 2017-09-24 DIAGNOSIS — J3089 Other allergic rhinitis: Secondary | ICD-10-CM | POA: Diagnosis not present

## 2017-09-24 DIAGNOSIS — J3081 Allergic rhinitis due to animal (cat) (dog) hair and dander: Secondary | ICD-10-CM | POA: Diagnosis not present

## 2017-09-24 DIAGNOSIS — J301 Allergic rhinitis due to pollen: Secondary | ICD-10-CM | POA: Diagnosis not present

## 2017-09-29 DIAGNOSIS — J3081 Allergic rhinitis due to animal (cat) (dog) hair and dander: Secondary | ICD-10-CM | POA: Diagnosis not present

## 2017-09-29 DIAGNOSIS — J3089 Other allergic rhinitis: Secondary | ICD-10-CM | POA: Diagnosis not present

## 2017-09-29 DIAGNOSIS — J301 Allergic rhinitis due to pollen: Secondary | ICD-10-CM | POA: Diagnosis not present

## 2017-10-06 DIAGNOSIS — J3081 Allergic rhinitis due to animal (cat) (dog) hair and dander: Secondary | ICD-10-CM | POA: Diagnosis not present

## 2017-10-06 DIAGNOSIS — J3089 Other allergic rhinitis: Secondary | ICD-10-CM | POA: Diagnosis not present

## 2017-10-06 DIAGNOSIS — J301 Allergic rhinitis due to pollen: Secondary | ICD-10-CM | POA: Diagnosis not present

## 2017-10-13 DIAGNOSIS — J301 Allergic rhinitis due to pollen: Secondary | ICD-10-CM | POA: Diagnosis not present

## 2017-10-13 DIAGNOSIS — J3081 Allergic rhinitis due to animal (cat) (dog) hair and dander: Secondary | ICD-10-CM | POA: Diagnosis not present

## 2017-10-13 DIAGNOSIS — J3089 Other allergic rhinitis: Secondary | ICD-10-CM | POA: Diagnosis not present

## 2017-10-22 DIAGNOSIS — J3089 Other allergic rhinitis: Secondary | ICD-10-CM | POA: Diagnosis not present

## 2017-10-22 DIAGNOSIS — J3081 Allergic rhinitis due to animal (cat) (dog) hair and dander: Secondary | ICD-10-CM | POA: Diagnosis not present

## 2017-10-22 DIAGNOSIS — J301 Allergic rhinitis due to pollen: Secondary | ICD-10-CM | POA: Diagnosis not present

## 2017-10-28 DIAGNOSIS — J3089 Other allergic rhinitis: Secondary | ICD-10-CM | POA: Diagnosis not present

## 2017-10-28 DIAGNOSIS — J3081 Allergic rhinitis due to animal (cat) (dog) hair and dander: Secondary | ICD-10-CM | POA: Diagnosis not present

## 2017-10-28 DIAGNOSIS — J301 Allergic rhinitis due to pollen: Secondary | ICD-10-CM | POA: Diagnosis not present

## 2017-11-03 DIAGNOSIS — J3081 Allergic rhinitis due to animal (cat) (dog) hair and dander: Secondary | ICD-10-CM | POA: Diagnosis not present

## 2017-11-03 DIAGNOSIS — J3089 Other allergic rhinitis: Secondary | ICD-10-CM | POA: Diagnosis not present

## 2017-11-03 DIAGNOSIS — J301 Allergic rhinitis due to pollen: Secondary | ICD-10-CM | POA: Diagnosis not present

## 2017-11-06 DIAGNOSIS — J301 Allergic rhinitis due to pollen: Secondary | ICD-10-CM | POA: Diagnosis not present

## 2017-11-06 DIAGNOSIS — J3089 Other allergic rhinitis: Secondary | ICD-10-CM | POA: Diagnosis not present

## 2017-11-06 DIAGNOSIS — J3081 Allergic rhinitis due to animal (cat) (dog) hair and dander: Secondary | ICD-10-CM | POA: Diagnosis not present

## 2017-11-06 DIAGNOSIS — H1045 Other chronic allergic conjunctivitis: Secondary | ICD-10-CM | POA: Diagnosis not present

## 2017-11-09 DIAGNOSIS — J3081 Allergic rhinitis due to animal (cat) (dog) hair and dander: Secondary | ICD-10-CM | POA: Diagnosis not present

## 2017-11-09 DIAGNOSIS — J3089 Other allergic rhinitis: Secondary | ICD-10-CM | POA: Diagnosis not present

## 2017-11-18 DIAGNOSIS — J301 Allergic rhinitis due to pollen: Secondary | ICD-10-CM | POA: Diagnosis not present

## 2017-11-18 DIAGNOSIS — J3081 Allergic rhinitis due to animal (cat) (dog) hair and dander: Secondary | ICD-10-CM | POA: Diagnosis not present

## 2017-11-18 DIAGNOSIS — J3089 Other allergic rhinitis: Secondary | ICD-10-CM | POA: Diagnosis not present

## 2017-11-20 DIAGNOSIS — J3089 Other allergic rhinitis: Secondary | ICD-10-CM | POA: Diagnosis not present

## 2017-11-20 DIAGNOSIS — J301 Allergic rhinitis due to pollen: Secondary | ICD-10-CM | POA: Diagnosis not present

## 2017-11-20 DIAGNOSIS — J3081 Allergic rhinitis due to animal (cat) (dog) hair and dander: Secondary | ICD-10-CM | POA: Diagnosis not present

## 2017-12-04 ENCOUNTER — Other Ambulatory Visit: Payer: Self-pay | Admitting: Family Medicine

## 2017-12-04 DIAGNOSIS — J3089 Other allergic rhinitis: Secondary | ICD-10-CM | POA: Diagnosis not present

## 2017-12-04 DIAGNOSIS — J3081 Allergic rhinitis due to animal (cat) (dog) hair and dander: Secondary | ICD-10-CM | POA: Diagnosis not present

## 2017-12-04 DIAGNOSIS — J301 Allergic rhinitis due to pollen: Secondary | ICD-10-CM | POA: Diagnosis not present

## 2017-12-04 NOTE — Telephone Encounter (Signed)
Last OV   Last refilled   Sent to PCP for approval

## 2017-12-18 DIAGNOSIS — J3081 Allergic rhinitis due to animal (cat) (dog) hair and dander: Secondary | ICD-10-CM | POA: Diagnosis not present

## 2017-12-18 DIAGNOSIS — J3089 Other allergic rhinitis: Secondary | ICD-10-CM | POA: Diagnosis not present

## 2017-12-18 DIAGNOSIS — J301 Allergic rhinitis due to pollen: Secondary | ICD-10-CM | POA: Diagnosis not present

## 2017-12-21 DIAGNOSIS — J3081 Allergic rhinitis due to animal (cat) (dog) hair and dander: Secondary | ICD-10-CM | POA: Diagnosis not present

## 2017-12-21 DIAGNOSIS — J3089 Other allergic rhinitis: Secondary | ICD-10-CM | POA: Diagnosis not present

## 2017-12-21 DIAGNOSIS — J301 Allergic rhinitis due to pollen: Secondary | ICD-10-CM | POA: Diagnosis not present

## 2017-12-25 DIAGNOSIS — J3089 Other allergic rhinitis: Secondary | ICD-10-CM | POA: Diagnosis not present

## 2017-12-25 DIAGNOSIS — J301 Allergic rhinitis due to pollen: Secondary | ICD-10-CM | POA: Diagnosis not present

## 2017-12-25 DIAGNOSIS — J3081 Allergic rhinitis due to animal (cat) (dog) hair and dander: Secondary | ICD-10-CM | POA: Diagnosis not present

## 2017-12-29 DIAGNOSIS — J3089 Other allergic rhinitis: Secondary | ICD-10-CM | POA: Diagnosis not present

## 2017-12-29 DIAGNOSIS — J301 Allergic rhinitis due to pollen: Secondary | ICD-10-CM | POA: Diagnosis not present

## 2017-12-29 DIAGNOSIS — J3081 Allergic rhinitis due to animal (cat) (dog) hair and dander: Secondary | ICD-10-CM | POA: Diagnosis not present

## 2018-01-04 DIAGNOSIS — J301 Allergic rhinitis due to pollen: Secondary | ICD-10-CM | POA: Diagnosis not present

## 2018-01-04 DIAGNOSIS — J3081 Allergic rhinitis due to animal (cat) (dog) hair and dander: Secondary | ICD-10-CM | POA: Diagnosis not present

## 2018-01-04 DIAGNOSIS — J3089 Other allergic rhinitis: Secondary | ICD-10-CM | POA: Diagnosis not present

## 2018-01-11 DIAGNOSIS — J301 Allergic rhinitis due to pollen: Secondary | ICD-10-CM | POA: Diagnosis not present

## 2018-01-11 DIAGNOSIS — J3081 Allergic rhinitis due to animal (cat) (dog) hair and dander: Secondary | ICD-10-CM | POA: Diagnosis not present

## 2018-01-11 DIAGNOSIS — J3089 Other allergic rhinitis: Secondary | ICD-10-CM | POA: Diagnosis not present

## 2018-01-19 DIAGNOSIS — J3089 Other allergic rhinitis: Secondary | ICD-10-CM | POA: Diagnosis not present

## 2018-01-19 DIAGNOSIS — J301 Allergic rhinitis due to pollen: Secondary | ICD-10-CM | POA: Diagnosis not present

## 2018-01-19 DIAGNOSIS — J3081 Allergic rhinitis due to animal (cat) (dog) hair and dander: Secondary | ICD-10-CM | POA: Diagnosis not present

## 2018-01-28 DIAGNOSIS — J301 Allergic rhinitis due to pollen: Secondary | ICD-10-CM | POA: Diagnosis not present

## 2018-01-28 DIAGNOSIS — J3081 Allergic rhinitis due to animal (cat) (dog) hair and dander: Secondary | ICD-10-CM | POA: Diagnosis not present

## 2018-01-28 DIAGNOSIS — J3089 Other allergic rhinitis: Secondary | ICD-10-CM | POA: Diagnosis not present

## 2018-02-04 DIAGNOSIS — J3081 Allergic rhinitis due to animal (cat) (dog) hair and dander: Secondary | ICD-10-CM | POA: Diagnosis not present

## 2018-02-04 DIAGNOSIS — J301 Allergic rhinitis due to pollen: Secondary | ICD-10-CM | POA: Diagnosis not present

## 2018-02-04 DIAGNOSIS — J3089 Other allergic rhinitis: Secondary | ICD-10-CM | POA: Diagnosis not present

## 2018-02-12 DIAGNOSIS — J3081 Allergic rhinitis due to animal (cat) (dog) hair and dander: Secondary | ICD-10-CM | POA: Diagnosis not present

## 2018-02-12 DIAGNOSIS — J3089 Other allergic rhinitis: Secondary | ICD-10-CM | POA: Diagnosis not present

## 2018-02-12 DIAGNOSIS — J301 Allergic rhinitis due to pollen: Secondary | ICD-10-CM | POA: Diagnosis not present

## 2018-02-16 DIAGNOSIS — J3081 Allergic rhinitis due to animal (cat) (dog) hair and dander: Secondary | ICD-10-CM | POA: Diagnosis not present

## 2018-02-16 DIAGNOSIS — J3089 Other allergic rhinitis: Secondary | ICD-10-CM | POA: Diagnosis not present

## 2018-02-16 DIAGNOSIS — J301 Allergic rhinitis due to pollen: Secondary | ICD-10-CM | POA: Diagnosis not present

## 2018-03-02 DIAGNOSIS — J3081 Allergic rhinitis due to animal (cat) (dog) hair and dander: Secondary | ICD-10-CM | POA: Diagnosis not present

## 2018-03-02 DIAGNOSIS — J301 Allergic rhinitis due to pollen: Secondary | ICD-10-CM | POA: Diagnosis not present

## 2018-03-02 DIAGNOSIS — J3089 Other allergic rhinitis: Secondary | ICD-10-CM | POA: Diagnosis not present

## 2018-03-03 ENCOUNTER — Other Ambulatory Visit: Payer: Self-pay | Admitting: Family Medicine

## 2018-03-03 MED ORDER — ZOLPIDEM TARTRATE 10 MG PO TABS: ORAL_TABLET | ORAL | 2 refills | Status: DC

## 2018-03-03 NOTE — Telephone Encounter (Signed)
Please review refill for zolpidem 10 mg.   LOV  06/10/17.  LR  06/19/17 for 10 tabs. Prescription expired on 12/16/17.  Need urine screen and med not delegated per protocol.  PCP  Dr. Clent RidgesFry

## 2018-03-03 NOTE — Telephone Encounter (Signed)
Call in #30 with 2 rf 

## 2018-03-03 NOTE — Telephone Encounter (Signed)
Dr. Fry please advise on refill of medication.  Thanks  

## 2018-03-03 NOTE — Telephone Encounter (Signed)
Copied from CRM [id number]. Topic: Quick Communication - Rx Refill/Question >> Mar 03, 2018 11:42 AM Arlyss Gandyichardson, Yarelli Decelles N, NT wrote: Medication: zolpidem (AMBIEN) 10 MG tablet   Has the patient contacted their pharmacy? Yes.   (Agent: If no, request that the patient contact the pharmacy for the refill.) (Agent: If yes, when and what did the pharmacy advise?)  Preferred Pharmacy (with phone number or street name): [id number] - [place], [place] - [address] AT [facility] [phone] (Phone) [phone] (Fax)    Agent: Please be advised that RX refills may take up to 3 business days. We ask that you follow-up with your pharmacy.

## 2018-03-10 DIAGNOSIS — J3081 Allergic rhinitis due to animal (cat) (dog) hair and dander: Secondary | ICD-10-CM | POA: Diagnosis not present

## 2018-03-10 DIAGNOSIS — J301 Allergic rhinitis due to pollen: Secondary | ICD-10-CM | POA: Diagnosis not present

## 2018-03-10 DIAGNOSIS — J3089 Other allergic rhinitis: Secondary | ICD-10-CM | POA: Diagnosis not present

## 2018-03-24 DIAGNOSIS — J3089 Other allergic rhinitis: Secondary | ICD-10-CM | POA: Diagnosis not present

## 2018-03-24 DIAGNOSIS — J3081 Allergic rhinitis due to animal (cat) (dog) hair and dander: Secondary | ICD-10-CM | POA: Diagnosis not present

## 2018-03-24 DIAGNOSIS — J301 Allergic rhinitis due to pollen: Secondary | ICD-10-CM | POA: Diagnosis not present

## 2018-03-29 DIAGNOSIS — H524 Presbyopia: Secondary | ICD-10-CM | POA: Diagnosis not present

## 2018-04-12 DIAGNOSIS — J301 Allergic rhinitis due to pollen: Secondary | ICD-10-CM | POA: Diagnosis not present

## 2018-04-12 DIAGNOSIS — J3089 Other allergic rhinitis: Secondary | ICD-10-CM | POA: Diagnosis not present

## 2018-04-12 DIAGNOSIS — J3081 Allergic rhinitis due to animal (cat) (dog) hair and dander: Secondary | ICD-10-CM | POA: Diagnosis not present

## 2018-04-20 DIAGNOSIS — J301 Allergic rhinitis due to pollen: Secondary | ICD-10-CM | POA: Diagnosis not present

## 2018-04-20 DIAGNOSIS — J3089 Other allergic rhinitis: Secondary | ICD-10-CM | POA: Diagnosis not present

## 2018-04-20 DIAGNOSIS — J3081 Allergic rhinitis due to animal (cat) (dog) hair and dander: Secondary | ICD-10-CM | POA: Diagnosis not present

## 2018-04-23 DIAGNOSIS — L700 Acne vulgaris: Secondary | ICD-10-CM | POA: Diagnosis not present

## 2018-04-23 DIAGNOSIS — I10 Essential (primary) hypertension: Secondary | ICD-10-CM | POA: Diagnosis not present

## 2018-04-23 DIAGNOSIS — Z79899 Other long term (current) drug therapy: Secondary | ICD-10-CM | POA: Diagnosis not present

## 2018-05-07 DIAGNOSIS — J3081 Allergic rhinitis due to animal (cat) (dog) hair and dander: Secondary | ICD-10-CM | POA: Diagnosis not present

## 2018-05-07 DIAGNOSIS — J3089 Other allergic rhinitis: Secondary | ICD-10-CM | POA: Diagnosis not present

## 2018-05-07 DIAGNOSIS — J301 Allergic rhinitis due to pollen: Secondary | ICD-10-CM | POA: Diagnosis not present

## 2018-05-20 DIAGNOSIS — J301 Allergic rhinitis due to pollen: Secondary | ICD-10-CM | POA: Diagnosis not present

## 2018-05-20 DIAGNOSIS — J3081 Allergic rhinitis due to animal (cat) (dog) hair and dander: Secondary | ICD-10-CM | POA: Diagnosis not present

## 2018-05-20 DIAGNOSIS — J3089 Other allergic rhinitis: Secondary | ICD-10-CM | POA: Diagnosis not present

## 2018-05-25 DIAGNOSIS — Z79899 Other long term (current) drug therapy: Secondary | ICD-10-CM | POA: Diagnosis not present

## 2018-05-25 DIAGNOSIS — L700 Acne vulgaris: Secondary | ICD-10-CM | POA: Diagnosis not present

## 2018-05-31 ENCOUNTER — Ambulatory Visit (INDEPENDENT_AMBULATORY_CARE_PROVIDER_SITE_OTHER): Payer: BLUE CROSS/BLUE SHIELD | Admitting: Family Medicine

## 2018-05-31 ENCOUNTER — Encounter: Payer: Self-pay | Admitting: Family Medicine

## 2018-05-31 VITALS — BP 110/78 | HR 68 | Temp 98.4°F | Ht 68.5 in | Wt 175.4 lb

## 2018-05-31 DIAGNOSIS — J019 Acute sinusitis, unspecified: Secondary | ICD-10-CM | POA: Diagnosis not present

## 2018-05-31 MED ORDER — METHYLPREDNISOLONE ACETATE 80 MG/ML IJ SUSP
120.0000 mg | Freq: Once | INTRAMUSCULAR | Status: AC
  Administered 2018-05-31: 120 mg via INTRAMUSCULAR

## 2018-05-31 MED ORDER — LEVOFLOXACIN 500 MG PO TABS: 500.0000 mg | ORAL_TABLET | Freq: Every day | ORAL | 0 refills | Status: AC

## 2018-05-31 NOTE — Progress Notes (Signed)
   Subjective:    Patient ID: Brent Bennett, male    DOB: 02-21-1966, 53 y.o.   MRN: [id number]  HPI Here for 4 days of sinus pressure, PND, pain in the right ear, and a dry cough. No fever. Taking Allegra D.    Review of Systems  Constitutional: Negative.   HENT: Positive for congestion, ear pain, postnasal drip and sinus pressure. Negative for sinus pain and sore throat.   Eyes: Negative.   Respiratory: Positive for cough.        Objective:   Physical Exam Constitutional:      Appearance: Normal appearance. He is well-developed.  HENT:     Right Ear: Tympanic membrane and ear canal normal.     Left Ear: Tympanic membrane and ear canal normal.     Nose: Nose normal.     Mouth/Throat:     Pharynx: Oropharynx is clear.  Eyes:     Conjunctiva/sclera: Conjunctivae normal.  Pulmonary:     Effort: Pulmonary effort is normal.     Breath sounds: Normal breath sounds.  Lymphadenopathy:     Cervical: No cervical adenopathy.  Neurological:     Mental Status: He is alert.           Assessment & Plan:  Sinusitis, treat with Levaquin. Given as steroid shot. Switch to Mucinex D bid.  Gershon Crane, MD

## 2018-06-04 DIAGNOSIS — J3089 Other allergic rhinitis: Secondary | ICD-10-CM | POA: Diagnosis not present

## 2018-06-04 DIAGNOSIS — J301 Allergic rhinitis due to pollen: Secondary | ICD-10-CM | POA: Diagnosis not present

## 2018-06-04 DIAGNOSIS — J3081 Allergic rhinitis due to animal (cat) (dog) hair and dander: Secondary | ICD-10-CM | POA: Diagnosis not present

## 2018-06-07 DIAGNOSIS — J301 Allergic rhinitis due to pollen: Secondary | ICD-10-CM | POA: Diagnosis not present

## 2018-06-08 DIAGNOSIS — J3081 Allergic rhinitis due to animal (cat) (dog) hair and dander: Secondary | ICD-10-CM | POA: Diagnosis not present

## 2018-06-08 DIAGNOSIS — J3089 Other allergic rhinitis: Secondary | ICD-10-CM | POA: Diagnosis not present

## 2018-06-15 DIAGNOSIS — J301 Allergic rhinitis due to pollen: Secondary | ICD-10-CM | POA: Diagnosis not present

## 2018-06-15 DIAGNOSIS — J3081 Allergic rhinitis due to animal (cat) (dog) hair and dander: Secondary | ICD-10-CM | POA: Diagnosis not present

## 2018-06-15 DIAGNOSIS — J3089 Other allergic rhinitis: Secondary | ICD-10-CM | POA: Diagnosis not present

## 2018-06-25 DIAGNOSIS — Z79899 Other long term (current) drug therapy: Secondary | ICD-10-CM | POA: Diagnosis not present

## 2018-06-25 DIAGNOSIS — L700 Acne vulgaris: Secondary | ICD-10-CM | POA: Diagnosis not present

## 2018-06-28 ENCOUNTER — Telehealth: Payer: Self-pay | Admitting: Family Medicine

## 2018-06-28 MED ORDER — LEVOFLOXACIN 500 MG PO TABS: 500.0000 mg | ORAL_TABLET | Freq: Every day | ORAL | 0 refills | Status: AC

## 2018-06-28 NOTE — Telephone Encounter (Signed)
He has another sinus infection

## 2018-07-01 DIAGNOSIS — J3089 Other allergic rhinitis: Secondary | ICD-10-CM | POA: Diagnosis not present

## 2018-07-01 DIAGNOSIS — J3081 Allergic rhinitis due to animal (cat) (dog) hair and dander: Secondary | ICD-10-CM | POA: Diagnosis not present

## 2018-07-01 DIAGNOSIS — J301 Allergic rhinitis due to pollen: Secondary | ICD-10-CM | POA: Diagnosis not present

## 2018-07-08 DIAGNOSIS — J3089 Other allergic rhinitis: Secondary | ICD-10-CM | POA: Diagnosis not present

## 2018-07-08 DIAGNOSIS — J301 Allergic rhinitis due to pollen: Secondary | ICD-10-CM | POA: Diagnosis not present

## 2018-07-08 DIAGNOSIS — J3081 Allergic rhinitis due to animal (cat) (dog) hair and dander: Secondary | ICD-10-CM | POA: Diagnosis not present

## 2018-07-21 DIAGNOSIS — J3081 Allergic rhinitis due to animal (cat) (dog) hair and dander: Secondary | ICD-10-CM | POA: Diagnosis not present

## 2018-07-21 DIAGNOSIS — J301 Allergic rhinitis due to pollen: Secondary | ICD-10-CM | POA: Diagnosis not present

## 2018-07-21 DIAGNOSIS — J3089 Other allergic rhinitis: Secondary | ICD-10-CM | POA: Diagnosis not present

## 2018-07-30 DIAGNOSIS — J301 Allergic rhinitis due to pollen: Secondary | ICD-10-CM | POA: Diagnosis not present

## 2018-07-30 DIAGNOSIS — J3081 Allergic rhinitis due to animal (cat) (dog) hair and dander: Secondary | ICD-10-CM | POA: Diagnosis not present

## 2018-07-30 DIAGNOSIS — J3089 Other allergic rhinitis: Secondary | ICD-10-CM | POA: Diagnosis not present

## 2018-08-09 DIAGNOSIS — J3089 Other allergic rhinitis: Secondary | ICD-10-CM | POA: Diagnosis not present

## 2018-08-09 DIAGNOSIS — J3081 Allergic rhinitis due to animal (cat) (dog) hair and dander: Secondary | ICD-10-CM | POA: Diagnosis not present

## 2018-08-09 DIAGNOSIS — J301 Allergic rhinitis due to pollen: Secondary | ICD-10-CM | POA: Diagnosis not present

## 2018-08-10 DIAGNOSIS — Z79899 Other long term (current) drug therapy: Secondary | ICD-10-CM | POA: Diagnosis not present

## 2018-08-10 DIAGNOSIS — L700 Acne vulgaris: Secondary | ICD-10-CM | POA: Diagnosis not present

## 2018-08-11 DIAGNOSIS — J3089 Other allergic rhinitis: Secondary | ICD-10-CM | POA: Diagnosis not present

## 2018-08-11 DIAGNOSIS — J3081 Allergic rhinitis due to animal (cat) (dog) hair and dander: Secondary | ICD-10-CM | POA: Diagnosis not present

## 2018-08-11 DIAGNOSIS — J301 Allergic rhinitis due to pollen: Secondary | ICD-10-CM | POA: Diagnosis not present

## 2018-08-17 DIAGNOSIS — J3081 Allergic rhinitis due to animal (cat) (dog) hair and dander: Secondary | ICD-10-CM | POA: Diagnosis not present

## 2018-08-17 DIAGNOSIS — J301 Allergic rhinitis due to pollen: Secondary | ICD-10-CM | POA: Diagnosis not present

## 2018-08-17 DIAGNOSIS — J3089 Other allergic rhinitis: Secondary | ICD-10-CM | POA: Diagnosis not present

## 2018-08-23 DIAGNOSIS — J3081 Allergic rhinitis due to animal (cat) (dog) hair and dander: Secondary | ICD-10-CM | POA: Diagnosis not present

## 2018-08-23 DIAGNOSIS — J3089 Other allergic rhinitis: Secondary | ICD-10-CM | POA: Diagnosis not present

## 2018-08-23 DIAGNOSIS — J301 Allergic rhinitis due to pollen: Secondary | ICD-10-CM | POA: Diagnosis not present

## 2018-09-02 DIAGNOSIS — J3089 Other allergic rhinitis: Secondary | ICD-10-CM | POA: Diagnosis not present

## 2018-09-02 DIAGNOSIS — J301 Allergic rhinitis due to pollen: Secondary | ICD-10-CM | POA: Diagnosis not present

## 2018-09-02 DIAGNOSIS — J3081 Allergic rhinitis due to animal (cat) (dog) hair and dander: Secondary | ICD-10-CM | POA: Diagnosis not present

## 2018-09-08 DIAGNOSIS — J3089 Other allergic rhinitis: Secondary | ICD-10-CM | POA: Diagnosis not present

## 2018-09-08 DIAGNOSIS — J3081 Allergic rhinitis due to animal (cat) (dog) hair and dander: Secondary | ICD-10-CM | POA: Diagnosis not present

## 2018-09-08 DIAGNOSIS — J301 Allergic rhinitis due to pollen: Secondary | ICD-10-CM | POA: Diagnosis not present

## 2018-09-13 ENCOUNTER — Other Ambulatory Visit: Payer: BLUE CROSS/BLUE SHIELD

## 2018-09-13 ENCOUNTER — Other Ambulatory Visit: Payer: Self-pay | Admitting: Internal Medicine

## 2018-09-13 ENCOUNTER — Ambulatory Visit: Payer: Self-pay | Admitting: Family Medicine

## 2018-09-13 DIAGNOSIS — Z20822 Contact with and (suspected) exposure to covid-19: Secondary | ICD-10-CM

## 2018-09-13 DIAGNOSIS — J069 Acute upper respiratory infection, unspecified: Secondary | ICD-10-CM | POA: Diagnosis not present

## 2018-09-13 DIAGNOSIS — J301 Allergic rhinitis due to pollen: Secondary | ICD-10-CM | POA: Diagnosis not present

## 2018-09-13 DIAGNOSIS — J3089 Other allergic rhinitis: Secondary | ICD-10-CM | POA: Diagnosis not present

## 2018-09-13 DIAGNOSIS — J3081 Allergic rhinitis due to animal (cat) (dog) hair and dander: Secondary | ICD-10-CM | POA: Diagnosis not present

## 2018-09-13 NOTE — Telephone Encounter (Signed)
I've not been exposed to COVID-19 and not having any symptoms.    I'm going to [place] to be with my brother who has cancer.   I'm requesting to be tested.    I scheduled him for today at 11:45 at the [facility] location in [place].  I instructed him to stay in his car and wear a mask.  He verbalized understanding.  Order entered.

## 2018-09-14 DIAGNOSIS — L700 Acne vulgaris: Secondary | ICD-10-CM | POA: Diagnosis not present

## 2018-09-14 DIAGNOSIS — Z79899 Other long term (current) drug therapy: Secondary | ICD-10-CM | POA: Diagnosis not present

## 2018-09-14 LAB — NOVEL CORONAVIRUS, NAA: SARS-CoV-2, NAA: NOT DETECTED

## 2018-09-20 ENCOUNTER — Telehealth: Payer: Self-pay

## 2018-09-20 NOTE — Telephone Encounter (Signed)
Pt. Called to request results of COVID test.  Advised that COVID test was negative/ not detected.   Pt. Verb. Understanding.

## 2018-09-21 DIAGNOSIS — L570 Actinic keratosis: Secondary | ICD-10-CM | POA: Diagnosis not present

## 2018-09-21 DIAGNOSIS — D225 Melanocytic nevi of trunk: Secondary | ICD-10-CM | POA: Diagnosis not present

## 2018-09-21 DIAGNOSIS — D224 Melanocytic nevi of scalp and neck: Secondary | ICD-10-CM | POA: Diagnosis not present

## 2018-09-21 DIAGNOSIS — Z86018 Personal history of other benign neoplasm: Secondary | ICD-10-CM | POA: Diagnosis not present

## 2018-09-21 DIAGNOSIS — D2271 Melanocytic nevi of right lower limb, including hip: Secondary | ICD-10-CM | POA: Diagnosis not present

## 2018-09-22 DIAGNOSIS — J301 Allergic rhinitis due to pollen: Secondary | ICD-10-CM | POA: Diagnosis not present

## 2018-09-22 DIAGNOSIS — J3081 Allergic rhinitis due to animal (cat) (dog) hair and dander: Secondary | ICD-10-CM | POA: Diagnosis not present

## 2018-09-22 DIAGNOSIS — J3089 Other allergic rhinitis: Secondary | ICD-10-CM | POA: Diagnosis not present

## 2018-10-05 DIAGNOSIS — J3089 Other allergic rhinitis: Secondary | ICD-10-CM | POA: Diagnosis not present

## 2018-10-05 DIAGNOSIS — J301 Allergic rhinitis due to pollen: Secondary | ICD-10-CM | POA: Diagnosis not present

## 2018-10-05 DIAGNOSIS — J3081 Allergic rhinitis due to animal (cat) (dog) hair and dander: Secondary | ICD-10-CM | POA: Diagnosis not present

## 2018-10-26 DIAGNOSIS — J301 Allergic rhinitis due to pollen: Secondary | ICD-10-CM | POA: Diagnosis not present

## 2018-10-26 DIAGNOSIS — J3089 Other allergic rhinitis: Secondary | ICD-10-CM | POA: Diagnosis not present

## 2018-10-26 DIAGNOSIS — J3081 Allergic rhinitis due to animal (cat) (dog) hair and dander: Secondary | ICD-10-CM | POA: Diagnosis not present

## 2018-10-27 ENCOUNTER — Telehealth: Payer: Self-pay | Admitting: Family Medicine

## 2018-10-27 DIAGNOSIS — Z209 Contact with and (suspected) exposure to unspecified communicable disease: Secondary | ICD-10-CM

## 2018-10-27 NOTE — Telephone Encounter (Signed)
He just returned from a trip to [place] and he needs a Covid test to go back to work. We will set this up

## 2018-11-01 ENCOUNTER — Other Ambulatory Visit: Payer: Self-pay

## 2018-11-01 DIAGNOSIS — Z20822 Contact with and (suspected) exposure to covid-19: Secondary | ICD-10-CM

## 2018-11-02 LAB — NOVEL CORONAVIRUS, NAA: SARS-CoV-2, NAA: NOT DETECTED

## 2018-11-04 DIAGNOSIS — Z79899 Other long term (current) drug therapy: Secondary | ICD-10-CM | POA: Diagnosis not present

## 2018-11-04 DIAGNOSIS — L700 Acne vulgaris: Secondary | ICD-10-CM | POA: Diagnosis not present

## 2018-11-10 DIAGNOSIS — M9903 Segmental and somatic dysfunction of lumbar region: Secondary | ICD-10-CM | POA: Diagnosis not present

## 2018-11-10 DIAGNOSIS — J301 Allergic rhinitis due to pollen: Secondary | ICD-10-CM | POA: Diagnosis not present

## 2018-11-10 DIAGNOSIS — J3089 Other allergic rhinitis: Secondary | ICD-10-CM | POA: Diagnosis not present

## 2018-11-10 DIAGNOSIS — M62830 Muscle spasm of back: Secondary | ICD-10-CM | POA: Diagnosis not present

## 2018-11-10 DIAGNOSIS — J3081 Allergic rhinitis due to animal (cat) (dog) hair and dander: Secondary | ICD-10-CM | POA: Diagnosis not present

## 2018-11-10 DIAGNOSIS — M9905 Segmental and somatic dysfunction of pelvic region: Secondary | ICD-10-CM | POA: Diagnosis not present

## 2018-11-10 DIAGNOSIS — M9902 Segmental and somatic dysfunction of thoracic region: Secondary | ICD-10-CM | POA: Diagnosis not present

## 2018-11-19 DIAGNOSIS — J301 Allergic rhinitis due to pollen: Secondary | ICD-10-CM | POA: Diagnosis not present

## 2018-11-19 DIAGNOSIS — H1045 Other chronic allergic conjunctivitis: Secondary | ICD-10-CM | POA: Diagnosis not present

## 2018-11-19 DIAGNOSIS — J3089 Other allergic rhinitis: Secondary | ICD-10-CM | POA: Diagnosis not present

## 2018-11-19 DIAGNOSIS — J3081 Allergic rhinitis due to animal (cat) (dog) hair and dander: Secondary | ICD-10-CM | POA: Diagnosis not present

## 2018-11-26 DIAGNOSIS — M9903 Segmental and somatic dysfunction of lumbar region: Secondary | ICD-10-CM | POA: Diagnosis not present

## 2018-11-26 DIAGNOSIS — M9902 Segmental and somatic dysfunction of thoracic region: Secondary | ICD-10-CM | POA: Diagnosis not present

## 2018-11-26 DIAGNOSIS — M9905 Segmental and somatic dysfunction of pelvic region: Secondary | ICD-10-CM | POA: Diagnosis not present

## 2018-11-26 DIAGNOSIS — M62830 Muscle spasm of back: Secondary | ICD-10-CM | POA: Diagnosis not present

## 2018-12-03 DIAGNOSIS — M62830 Muscle spasm of back: Secondary | ICD-10-CM | POA: Diagnosis not present

## 2018-12-03 DIAGNOSIS — M9903 Segmental and somatic dysfunction of lumbar region: Secondary | ICD-10-CM | POA: Diagnosis not present

## 2018-12-03 DIAGNOSIS — J3081 Allergic rhinitis due to animal (cat) (dog) hair and dander: Secondary | ICD-10-CM | POA: Diagnosis not present

## 2018-12-03 DIAGNOSIS — J301 Allergic rhinitis due to pollen: Secondary | ICD-10-CM | POA: Diagnosis not present

## 2018-12-03 DIAGNOSIS — J3089 Other allergic rhinitis: Secondary | ICD-10-CM | POA: Diagnosis not present

## 2018-12-03 DIAGNOSIS — M9902 Segmental and somatic dysfunction of thoracic region: Secondary | ICD-10-CM | POA: Diagnosis not present

## 2018-12-03 DIAGNOSIS — M9905 Segmental and somatic dysfunction of pelvic region: Secondary | ICD-10-CM | POA: Diagnosis not present

## 2018-12-07 DIAGNOSIS — L708 Other acne: Secondary | ICD-10-CM | POA: Diagnosis not present

## 2018-12-07 DIAGNOSIS — Z79899 Other long term (current) drug therapy: Secondary | ICD-10-CM | POA: Diagnosis not present

## 2018-12-07 DIAGNOSIS — L700 Acne vulgaris: Secondary | ICD-10-CM | POA: Diagnosis not present

## 2018-12-08 DIAGNOSIS — J301 Allergic rhinitis due to pollen: Secondary | ICD-10-CM | POA: Diagnosis not present

## 2018-12-08 DIAGNOSIS — J3081 Allergic rhinitis due to animal (cat) (dog) hair and dander: Secondary | ICD-10-CM | POA: Diagnosis not present

## 2018-12-08 DIAGNOSIS — J3089 Other allergic rhinitis: Secondary | ICD-10-CM | POA: Diagnosis not present

## 2018-12-10 DIAGNOSIS — M62830 Muscle spasm of back: Secondary | ICD-10-CM | POA: Diagnosis not present

## 2018-12-10 DIAGNOSIS — M9905 Segmental and somatic dysfunction of pelvic region: Secondary | ICD-10-CM | POA: Diagnosis not present

## 2018-12-10 DIAGNOSIS — M9902 Segmental and somatic dysfunction of thoracic region: Secondary | ICD-10-CM | POA: Diagnosis not present

## 2018-12-10 DIAGNOSIS — M9903 Segmental and somatic dysfunction of lumbar region: Secondary | ICD-10-CM | POA: Diagnosis not present

## 2018-12-17 DIAGNOSIS — M9905 Segmental and somatic dysfunction of pelvic region: Secondary | ICD-10-CM | POA: Diagnosis not present

## 2018-12-17 DIAGNOSIS — M62830 Muscle spasm of back: Secondary | ICD-10-CM | POA: Diagnosis not present

## 2018-12-17 DIAGNOSIS — M9903 Segmental and somatic dysfunction of lumbar region: Secondary | ICD-10-CM | POA: Diagnosis not present

## 2018-12-17 DIAGNOSIS — M9902 Segmental and somatic dysfunction of thoracic region: Secondary | ICD-10-CM | POA: Diagnosis not present

## 2018-12-22 DIAGNOSIS — J301 Allergic rhinitis due to pollen: Secondary | ICD-10-CM | POA: Diagnosis not present

## 2018-12-22 DIAGNOSIS — J3081 Allergic rhinitis due to animal (cat) (dog) hair and dander: Secondary | ICD-10-CM | POA: Diagnosis not present

## 2018-12-22 DIAGNOSIS — J3089 Other allergic rhinitis: Secondary | ICD-10-CM | POA: Diagnosis not present

## 2018-12-31 DIAGNOSIS — M62830 Muscle spasm of back: Secondary | ICD-10-CM | POA: Diagnosis not present

## 2018-12-31 DIAGNOSIS — M9902 Segmental and somatic dysfunction of thoracic region: Secondary | ICD-10-CM | POA: Diagnosis not present

## 2018-12-31 DIAGNOSIS — M9903 Segmental and somatic dysfunction of lumbar region: Secondary | ICD-10-CM | POA: Diagnosis not present

## 2018-12-31 DIAGNOSIS — M9905 Segmental and somatic dysfunction of pelvic region: Secondary | ICD-10-CM | POA: Diagnosis not present

## 2019-01-05 DIAGNOSIS — J3089 Other allergic rhinitis: Secondary | ICD-10-CM | POA: Diagnosis not present

## 2019-01-05 DIAGNOSIS — J301 Allergic rhinitis due to pollen: Secondary | ICD-10-CM | POA: Diagnosis not present

## 2019-01-05 DIAGNOSIS — J3081 Allergic rhinitis due to animal (cat) (dog) hair and dander: Secondary | ICD-10-CM | POA: Diagnosis not present

## 2019-01-13 DIAGNOSIS — J301 Allergic rhinitis due to pollen: Secondary | ICD-10-CM | POA: Diagnosis not present

## 2019-01-13 DIAGNOSIS — J3081 Allergic rhinitis due to animal (cat) (dog) hair and dander: Secondary | ICD-10-CM | POA: Diagnosis not present

## 2019-01-13 DIAGNOSIS — J3089 Other allergic rhinitis: Secondary | ICD-10-CM | POA: Diagnosis not present

## 2019-01-17 DIAGNOSIS — M62830 Muscle spasm of back: Secondary | ICD-10-CM | POA: Diagnosis not present

## 2019-01-17 DIAGNOSIS — M9905 Segmental and somatic dysfunction of pelvic region: Secondary | ICD-10-CM | POA: Diagnosis not present

## 2019-01-17 DIAGNOSIS — M9903 Segmental and somatic dysfunction of lumbar region: Secondary | ICD-10-CM | POA: Diagnosis not present

## 2019-01-17 DIAGNOSIS — M9902 Segmental and somatic dysfunction of thoracic region: Secondary | ICD-10-CM | POA: Diagnosis not present

## 2019-02-04 DIAGNOSIS — J3089 Other allergic rhinitis: Secondary | ICD-10-CM | POA: Diagnosis not present

## 2019-02-04 DIAGNOSIS — J3081 Allergic rhinitis due to animal (cat) (dog) hair and dander: Secondary | ICD-10-CM | POA: Diagnosis not present

## 2019-02-04 DIAGNOSIS — J301 Allergic rhinitis due to pollen: Secondary | ICD-10-CM | POA: Diagnosis not present

## 2019-02-08 ENCOUNTER — Other Ambulatory Visit: Payer: Self-pay

## 2019-02-08 DIAGNOSIS — Z20822 Contact with and (suspected) exposure to covid-19: Secondary | ICD-10-CM

## 2019-02-09 LAB — NOVEL CORONAVIRUS, NAA: SARS-CoV-2, NAA: NOT DETECTED

## 2019-02-17 DIAGNOSIS — M9903 Segmental and somatic dysfunction of lumbar region: Secondary | ICD-10-CM | POA: Diagnosis not present

## 2019-02-17 DIAGNOSIS — M9902 Segmental and somatic dysfunction of thoracic region: Secondary | ICD-10-CM | POA: Diagnosis not present

## 2019-02-17 DIAGNOSIS — M62830 Muscle spasm of back: Secondary | ICD-10-CM | POA: Diagnosis not present

## 2019-02-17 DIAGNOSIS — M9905 Segmental and somatic dysfunction of pelvic region: Secondary | ICD-10-CM | POA: Diagnosis not present

## 2019-03-16 DIAGNOSIS — J301 Allergic rhinitis due to pollen: Secondary | ICD-10-CM | POA: Diagnosis not present

## 2019-03-16 DIAGNOSIS — J3081 Allergic rhinitis due to animal (cat) (dog) hair and dander: Secondary | ICD-10-CM | POA: Diagnosis not present

## 2019-03-16 DIAGNOSIS — J3089 Other allergic rhinitis: Secondary | ICD-10-CM | POA: Diagnosis not present

## 2019-03-22 DIAGNOSIS — J301 Allergic rhinitis due to pollen: Secondary | ICD-10-CM | POA: Diagnosis not present

## 2019-03-22 DIAGNOSIS — J3081 Allergic rhinitis due to animal (cat) (dog) hair and dander: Secondary | ICD-10-CM | POA: Diagnosis not present

## 2019-03-22 DIAGNOSIS — J3089 Other allergic rhinitis: Secondary | ICD-10-CM | POA: Diagnosis not present

## 2019-03-28 DIAGNOSIS — J301 Allergic rhinitis due to pollen: Secondary | ICD-10-CM | POA: Diagnosis not present

## 2019-03-29 DIAGNOSIS — J301 Allergic rhinitis due to pollen: Secondary | ICD-10-CM | POA: Diagnosis not present

## 2019-03-29 DIAGNOSIS — J3081 Allergic rhinitis due to animal (cat) (dog) hair and dander: Secondary | ICD-10-CM | POA: Diagnosis not present

## 2019-03-29 DIAGNOSIS — J3089 Other allergic rhinitis: Secondary | ICD-10-CM | POA: Diagnosis not present

## 2019-04-07 DIAGNOSIS — J3089 Other allergic rhinitis: Secondary | ICD-10-CM | POA: Diagnosis not present

## 2019-04-07 DIAGNOSIS — J3081 Allergic rhinitis due to animal (cat) (dog) hair and dander: Secondary | ICD-10-CM | POA: Diagnosis not present

## 2019-04-07 DIAGNOSIS — J301 Allergic rhinitis due to pollen: Secondary | ICD-10-CM | POA: Diagnosis not present

## 2019-04-21 DIAGNOSIS — J301 Allergic rhinitis due to pollen: Secondary | ICD-10-CM | POA: Diagnosis not present

## 2019-04-21 DIAGNOSIS — J3081 Allergic rhinitis due to animal (cat) (dog) hair and dander: Secondary | ICD-10-CM | POA: Diagnosis not present

## 2019-04-21 DIAGNOSIS — J3089 Other allergic rhinitis: Secondary | ICD-10-CM | POA: Diagnosis not present

## 2019-05-02 DIAGNOSIS — J3081 Allergic rhinitis due to animal (cat) (dog) hair and dander: Secondary | ICD-10-CM | POA: Diagnosis not present

## 2019-05-02 DIAGNOSIS — J301 Allergic rhinitis due to pollen: Secondary | ICD-10-CM | POA: Diagnosis not present

## 2019-05-02 DIAGNOSIS — J3089 Other allergic rhinitis: Secondary | ICD-10-CM | POA: Diagnosis not present

## 2019-05-04 DIAGNOSIS — J301 Allergic rhinitis due to pollen: Secondary | ICD-10-CM | POA: Diagnosis not present

## 2019-05-04 DIAGNOSIS — J3089 Other allergic rhinitis: Secondary | ICD-10-CM | POA: Diagnosis not present

## 2019-05-04 DIAGNOSIS — J3081 Allergic rhinitis due to animal (cat) (dog) hair and dander: Secondary | ICD-10-CM | POA: Diagnosis not present

## 2019-05-11 DIAGNOSIS — J301 Allergic rhinitis due to pollen: Secondary | ICD-10-CM | POA: Diagnosis not present

## 2019-05-11 DIAGNOSIS — J3081 Allergic rhinitis due to animal (cat) (dog) hair and dander: Secondary | ICD-10-CM | POA: Diagnosis not present

## 2019-05-11 DIAGNOSIS — J3089 Other allergic rhinitis: Secondary | ICD-10-CM | POA: Diagnosis not present

## 2019-05-17 DIAGNOSIS — J3081 Allergic rhinitis due to animal (cat) (dog) hair and dander: Secondary | ICD-10-CM | POA: Diagnosis not present

## 2019-05-17 DIAGNOSIS — J3089 Other allergic rhinitis: Secondary | ICD-10-CM | POA: Diagnosis not present

## 2019-05-17 DIAGNOSIS — J301 Allergic rhinitis due to pollen: Secondary | ICD-10-CM | POA: Diagnosis not present

## 2019-05-20 DIAGNOSIS — J019 Acute sinusitis, unspecified: Secondary | ICD-10-CM | POA: Diagnosis not present

## 2019-05-20 DIAGNOSIS — J301 Allergic rhinitis due to pollen: Secondary | ICD-10-CM | POA: Diagnosis not present

## 2019-05-20 DIAGNOSIS — H1045 Other chronic allergic conjunctivitis: Secondary | ICD-10-CM | POA: Diagnosis not present

## 2019-05-20 DIAGNOSIS — J3081 Allergic rhinitis due to animal (cat) (dog) hair and dander: Secondary | ICD-10-CM | POA: Diagnosis not present

## 2019-05-23 ENCOUNTER — Other Ambulatory Visit: Payer: Self-pay

## 2019-05-24 ENCOUNTER — Ambulatory Visit (INDEPENDENT_AMBULATORY_CARE_PROVIDER_SITE_OTHER): Payer: Self-pay | Admitting: Family Medicine

## 2019-05-24 ENCOUNTER — Encounter: Payer: Self-pay | Admitting: Family Medicine

## 2019-05-24 VITALS — BP 130/80 | HR 67 | Temp 98.0°F | Ht 68.5 in | Wt 176.6 lb

## 2019-05-24 DIAGNOSIS — Z0000 Encounter for general adult medical examination without abnormal findings: Secondary | ICD-10-CM

## 2019-05-24 DIAGNOSIS — J342 Deviated nasal septum: Secondary | ICD-10-CM

## 2019-05-24 NOTE — Progress Notes (Signed)
Subjective:    Patient ID: Brent Bennett, male    DOB: 1965-12-26, 54 y.o.   MRN: [id number]  HPI Here for a well exam. He feels fine in general. He still sees Dr. [place] Callas for his allergies, and he had seen Dr. Suszanne Conners several years ago about fixing his deviated septum. He decided against it at that time, but now he wants to pursue this. He still works out and plays soccer.    Review of Systems  Constitutional: Negative.   HENT: Negative.   Eyes: Negative.   Respiratory: Negative.   Cardiovascular: Negative.   Gastrointestinal: Negative.   Genitourinary: Negative.   Musculoskeletal: Negative.   Skin: Negative.   Neurological: Negative.   Psychiatric/Behavioral: Negative.        Objective:   Physical Exam Constitutional:      General: He is not in acute distress.    Appearance: He is well-developed. He is not diaphoretic.  HENT:     Head: Normocephalic and atraumatic.     Right Ear: External ear normal.     Left Ear: External ear normal.     Nose: Nose normal.     Mouth/Throat:     Pharynx: No oropharyngeal exudate.  Eyes:     General: No scleral icterus.       Right eye: No discharge.        Left eye: No discharge.     Conjunctiva/sclera: Conjunctivae normal.     Pupils: Pupils are equal, round, and reactive to light.  Neck:     Thyroid: No thyromegaly.     Vascular: No JVD.     Trachea: No tracheal deviation.  Cardiovascular:     Rate and Rhythm: Normal rate and regular rhythm.     Heart sounds: Normal heart sounds. No murmur. No friction rub. No gallop.   Pulmonary:     Effort: Pulmonary effort is normal. No respiratory distress.     Breath sounds: Normal breath sounds. No wheezing or rales.  Chest:     Chest wall: No tenderness.  Abdominal:     General: Bowel sounds are normal. There is no distension.     Palpations: Abdomen is soft. There is no mass.     Tenderness: There is no abdominal tenderness. There is no guarding or rebound.  Genitourinary:  Penis: Normal. No tenderness.      Testes: Normal.     Prostate: Normal.     Rectum: Normal. Guaiac result negative.  Musculoskeletal:        General: No tenderness. Normal range of motion.     Cervical back: Neck supple.  Lymphadenopathy:     Cervical: No cervical adenopathy.  Skin:    General: Skin is warm and dry.     Coloration: Skin is not pale.     Findings: No erythema or rash.  Neurological:     Mental Status: He is alert and oriented to person, place, and time.     Cranial Nerves: No cranial nerve deficit.     Motor: No abnormal muscle tone.     Coordination: Coordination normal.     Deep Tendon Reflexes: Reflexes are normal and symmetric. Reflexes normal.  Psychiatric:        Behavior: Behavior normal.        Thought Content: Thought content normal.        Judgment: Judgment normal.           Assessment & Plan:  Well exam. We discussed diet and  exercise. Get fasting labs soon. We will refer him back to Dr. Benjamine Mola.  Alysia Penna, MD

## 2019-05-26 ENCOUNTER — Other Ambulatory Visit: Payer: Self-pay

## 2019-06-01 ENCOUNTER — Other Ambulatory Visit: Payer: Self-pay

## 2019-06-01 ENCOUNTER — Other Ambulatory Visit (INDEPENDENT_AMBULATORY_CARE_PROVIDER_SITE_OTHER): Payer: Self-pay

## 2019-06-01 DIAGNOSIS — J301 Allergic rhinitis due to pollen: Secondary | ICD-10-CM | POA: Diagnosis not present

## 2019-06-01 DIAGNOSIS — J3081 Allergic rhinitis due to animal (cat) (dog) hair and dander: Secondary | ICD-10-CM | POA: Diagnosis not present

## 2019-06-01 DIAGNOSIS — Z0000 Encounter for general adult medical examination without abnormal findings: Secondary | ICD-10-CM

## 2019-06-01 DIAGNOSIS — J3089 Other allergic rhinitis: Secondary | ICD-10-CM | POA: Diagnosis not present

## 2019-06-01 LAB — BASIC METABOLIC PANEL
BUN: 23 mg/dL (ref 6–23)
CO2: 28 mEq/L (ref 19–32)
Calcium: 9.6 mg/dL (ref 8.4–10.5)
Chloride: 103 mEq/L (ref 96–112)
Creatinine, Ser: 0.94 mg/dL (ref 0.40–1.50)
GFR: 83.76 mL/min (ref >60.00)
Glucose, Bld: 102 mg/dL — ABNORMAL HIGH (ref 70–99)
Potassium: 4.5 mEq/L (ref 3.5–5.1)
Sodium: 139 mEq/L (ref 135–145)

## 2019-06-01 LAB — HEPATIC FUNCTION PANEL
ALT: 17 U/L (ref 0–53)
AST: 17 U/L (ref 0–37)
Albumin: 4.4 g/dL (ref 3.5–5.2)
Alkaline Phosphatase: 79 U/L (ref 39–117)
Bilirubin, Direct: 0.3 mg/dL (ref 0.0–0.3)
Total Bilirubin: 2 mg/dL — ABNORMAL HIGH (ref 0.2–1.2)
Total Protein: 7.3 g/dL (ref 6.0–8.3)

## 2019-06-01 LAB — LIPID PANEL
Cholesterol: 272 mg/dL — ABNORMAL HIGH (ref 0–200)
HDL: 66.3 mg/dL (ref >39.00)
LDL Cholesterol: 187 mg/dL — ABNORMAL HIGH (ref 0–99)
NonHDL: 206.18
Total CHOL/HDL Ratio: 4
Triglycerides: 98 mg/dL (ref 0.0–149.0)
VLDL: 19.6 mg/dL (ref 0.0–40.0)

## 2019-06-01 LAB — CBC WITH DIFFERENTIAL/PLATELET
Basophils Absolute: 0 10*3/uL (ref 0.0–0.1)
Basophils Relative: 0.4 % (ref 0.0–3.0)
Eosinophils Absolute: 0.1 10*3/uL (ref 0.0–0.7)
Eosinophils Relative: 2.1 % (ref 0.0–5.0)
HCT: 47.2 % (ref 39.0–52.0)
Hemoglobin: 16 g/dL (ref 13.0–17.0)
Lymphocytes Relative: 35.3 % (ref 12.0–46.0)
Lymphs Abs: 1.7 10*3/uL (ref 0.7–4.0)
MCHC: 33.8 g/dL (ref 30.0–36.0)
MCV: 91.3 fl (ref 78.0–100.0)
Monocytes Absolute: 0.4 10*3/uL (ref 0.1–1.0)
Monocytes Relative: 7.5 % (ref 3.0–12.0)
Neutro Abs: 2.7 10*3/uL (ref 1.4–7.7)
Neutrophils Relative %: 54.7 % (ref 43.0–77.0)
Platelets: 216 10*3/uL (ref 150.0–400.0)
RBC: 5.18 Mil/uL (ref 4.22–5.81)
RDW: 13 % (ref 11.5–15.5)
WBC: 4.9 10*3/uL (ref 4.0–10.5)

## 2019-06-01 LAB — TSH: TSH: 2.02 u[IU]/mL (ref 0.35–4.50)

## 2019-06-01 LAB — PSA: PSA: 1.34 ng/mL (ref 0.10–4.00)

## 2019-06-02 LAB — SARS-COV-2 IGG: SARS-COV-2 IgG: 0.36

## 2019-06-03 DIAGNOSIS — J301 Allergic rhinitis due to pollen: Secondary | ICD-10-CM | POA: Diagnosis not present

## 2019-06-03 DIAGNOSIS — J3081 Allergic rhinitis due to animal (cat) (dog) hair and dander: Secondary | ICD-10-CM | POA: Diagnosis not present

## 2019-06-03 DIAGNOSIS — J3089 Other allergic rhinitis: Secondary | ICD-10-CM | POA: Diagnosis not present

## 2019-06-06 DIAGNOSIS — J3081 Allergic rhinitis due to animal (cat) (dog) hair and dander: Secondary | ICD-10-CM | POA: Diagnosis not present

## 2019-06-06 DIAGNOSIS — J3089 Other allergic rhinitis: Secondary | ICD-10-CM | POA: Diagnosis not present

## 2019-06-06 DIAGNOSIS — J301 Allergic rhinitis due to pollen: Secondary | ICD-10-CM | POA: Diagnosis not present

## 2019-06-14 DIAGNOSIS — J301 Allergic rhinitis due to pollen: Secondary | ICD-10-CM | POA: Diagnosis not present

## 2019-06-14 DIAGNOSIS — J3081 Allergic rhinitis due to animal (cat) (dog) hair and dander: Secondary | ICD-10-CM | POA: Diagnosis not present

## 2019-06-14 DIAGNOSIS — J3089 Other allergic rhinitis: Secondary | ICD-10-CM | POA: Diagnosis not present

## 2019-06-21 ENCOUNTER — Telehealth: Payer: Self-pay | Admitting: Family Medicine

## 2019-06-21 DIAGNOSIS — E785 Hyperlipidemia, unspecified: Secondary | ICD-10-CM

## 2019-06-21 MED ORDER — ATORVASTATIN CALCIUM 10 MG PO TABS: 10.0000 mg | ORAL_TABLET | Freq: Every day | ORAL | 3 refills | Status: DC

## 2019-06-21 NOTE — Telephone Encounter (Signed)
Done

## 2019-06-28 DIAGNOSIS — J3089 Other allergic rhinitis: Secondary | ICD-10-CM | POA: Diagnosis not present

## 2019-06-28 DIAGNOSIS — J310 Chronic rhinitis: Secondary | ICD-10-CM | POA: Diagnosis not present

## 2019-06-28 DIAGNOSIS — J342 Deviated nasal septum: Secondary | ICD-10-CM | POA: Diagnosis not present

## 2019-06-28 DIAGNOSIS — J301 Allergic rhinitis due to pollen: Secondary | ICD-10-CM | POA: Diagnosis not present

## 2019-06-28 DIAGNOSIS — J343 Hypertrophy of nasal turbinates: Secondary | ICD-10-CM | POA: Diagnosis not present

## 2019-06-28 DIAGNOSIS — J3081 Allergic rhinitis due to animal (cat) (dog) hair and dander: Secondary | ICD-10-CM | POA: Diagnosis not present

## 2019-07-18 DIAGNOSIS — J3089 Other allergic rhinitis: Secondary | ICD-10-CM | POA: Diagnosis not present

## 2019-07-18 DIAGNOSIS — J3081 Allergic rhinitis due to animal (cat) (dog) hair and dander: Secondary | ICD-10-CM | POA: Diagnosis not present

## 2019-07-18 DIAGNOSIS — J301 Allergic rhinitis due to pollen: Secondary | ICD-10-CM | POA: Diagnosis not present

## 2019-08-01 DIAGNOSIS — J3081 Allergic rhinitis due to animal (cat) (dog) hair and dander: Secondary | ICD-10-CM | POA: Diagnosis not present

## 2019-08-01 DIAGNOSIS — J3089 Other allergic rhinitis: Secondary | ICD-10-CM | POA: Diagnosis not present

## 2019-08-01 DIAGNOSIS — J301 Allergic rhinitis due to pollen: Secondary | ICD-10-CM | POA: Diagnosis not present

## 2019-08-09 DIAGNOSIS — J301 Allergic rhinitis due to pollen: Secondary | ICD-10-CM | POA: Diagnosis not present

## 2019-08-09 DIAGNOSIS — J3089 Other allergic rhinitis: Secondary | ICD-10-CM | POA: Diagnosis not present

## 2019-08-09 DIAGNOSIS — J3081 Allergic rhinitis due to animal (cat) (dog) hair and dander: Secondary | ICD-10-CM | POA: Diagnosis not present

## 2019-08-29 DIAGNOSIS — J301 Allergic rhinitis due to pollen: Secondary | ICD-10-CM | POA: Diagnosis not present

## 2019-08-29 DIAGNOSIS — J3081 Allergic rhinitis due to animal (cat) (dog) hair and dander: Secondary | ICD-10-CM | POA: Diagnosis not present

## 2019-08-29 DIAGNOSIS — J3089 Other allergic rhinitis: Secondary | ICD-10-CM | POA: Diagnosis not present

## 2019-09-16 DIAGNOSIS — J3081 Allergic rhinitis due to animal (cat) (dog) hair and dander: Secondary | ICD-10-CM | POA: Diagnosis not present

## 2019-09-16 DIAGNOSIS — J301 Allergic rhinitis due to pollen: Secondary | ICD-10-CM | POA: Diagnosis not present

## 2019-09-16 DIAGNOSIS — J3089 Other allergic rhinitis: Secondary | ICD-10-CM | POA: Diagnosis not present

## 2019-10-07 DIAGNOSIS — J3081 Allergic rhinitis due to animal (cat) (dog) hair and dander: Secondary | ICD-10-CM | POA: Diagnosis not present

## 2019-10-07 DIAGNOSIS — J301 Allergic rhinitis due to pollen: Secondary | ICD-10-CM | POA: Diagnosis not present

## 2019-10-07 DIAGNOSIS — J3089 Other allergic rhinitis: Secondary | ICD-10-CM | POA: Diagnosis not present

## 2019-10-18 DIAGNOSIS — J3089 Other allergic rhinitis: Secondary | ICD-10-CM | POA: Diagnosis not present

## 2019-10-18 DIAGNOSIS — J301 Allergic rhinitis due to pollen: Secondary | ICD-10-CM | POA: Diagnosis not present

## 2019-10-18 DIAGNOSIS — J3081 Allergic rhinitis due to animal (cat) (dog) hair and dander: Secondary | ICD-10-CM | POA: Diagnosis not present

## 2019-11-01 DIAGNOSIS — J3089 Other allergic rhinitis: Secondary | ICD-10-CM | POA: Diagnosis not present

## 2019-11-01 DIAGNOSIS — J3081 Allergic rhinitis due to animal (cat) (dog) hair and dander: Secondary | ICD-10-CM | POA: Diagnosis not present

## 2019-11-01 DIAGNOSIS — J301 Allergic rhinitis due to pollen: Secondary | ICD-10-CM | POA: Diagnosis not present

## 2019-11-18 DIAGNOSIS — J301 Allergic rhinitis due to pollen: Secondary | ICD-10-CM | POA: Diagnosis not present

## 2019-11-18 DIAGNOSIS — J3089 Other allergic rhinitis: Secondary | ICD-10-CM | POA: Diagnosis not present

## 2019-11-18 DIAGNOSIS — J3081 Allergic rhinitis due to animal (cat) (dog) hair and dander: Secondary | ICD-10-CM | POA: Diagnosis not present

## 2019-11-25 DIAGNOSIS — J3081 Allergic rhinitis due to animal (cat) (dog) hair and dander: Secondary | ICD-10-CM | POA: Diagnosis not present

## 2019-11-25 DIAGNOSIS — J3089 Other allergic rhinitis: Secondary | ICD-10-CM | POA: Diagnosis not present

## 2019-11-25 DIAGNOSIS — J301 Allergic rhinitis due to pollen: Secondary | ICD-10-CM | POA: Diagnosis not present

## 2019-11-28 DIAGNOSIS — J3089 Other allergic rhinitis: Secondary | ICD-10-CM | POA: Diagnosis not present

## 2019-11-28 DIAGNOSIS — J3081 Allergic rhinitis due to animal (cat) (dog) hair and dander: Secondary | ICD-10-CM | POA: Diagnosis not present

## 2019-12-01 DIAGNOSIS — J3081 Allergic rhinitis due to animal (cat) (dog) hair and dander: Secondary | ICD-10-CM | POA: Diagnosis not present

## 2019-12-01 DIAGNOSIS — J301 Allergic rhinitis due to pollen: Secondary | ICD-10-CM | POA: Diagnosis not present

## 2019-12-01 DIAGNOSIS — J3089 Other allergic rhinitis: Secondary | ICD-10-CM | POA: Diagnosis not present

## 2019-12-07 DIAGNOSIS — J3089 Other allergic rhinitis: Secondary | ICD-10-CM | POA: Diagnosis not present

## 2019-12-07 DIAGNOSIS — J3081 Allergic rhinitis due to animal (cat) (dog) hair and dander: Secondary | ICD-10-CM | POA: Diagnosis not present

## 2019-12-07 DIAGNOSIS — J301 Allergic rhinitis due to pollen: Secondary | ICD-10-CM | POA: Diagnosis not present

## 2019-12-15 DIAGNOSIS — J3081 Allergic rhinitis due to animal (cat) (dog) hair and dander: Secondary | ICD-10-CM | POA: Diagnosis not present

## 2019-12-15 DIAGNOSIS — J3089 Other allergic rhinitis: Secondary | ICD-10-CM | POA: Diagnosis not present

## 2019-12-15 DIAGNOSIS — J301 Allergic rhinitis due to pollen: Secondary | ICD-10-CM | POA: Diagnosis not present

## 2019-12-21 DIAGNOSIS — J3089 Other allergic rhinitis: Secondary | ICD-10-CM | POA: Diagnosis not present

## 2019-12-21 DIAGNOSIS — J3081 Allergic rhinitis due to animal (cat) (dog) hair and dander: Secondary | ICD-10-CM | POA: Diagnosis not present

## 2019-12-21 DIAGNOSIS — J301 Allergic rhinitis due to pollen: Secondary | ICD-10-CM | POA: Diagnosis not present

## 2019-12-28 DIAGNOSIS — J3089 Other allergic rhinitis: Secondary | ICD-10-CM | POA: Diagnosis not present

## 2019-12-28 DIAGNOSIS — J3081 Allergic rhinitis due to animal (cat) (dog) hair and dander: Secondary | ICD-10-CM | POA: Diagnosis not present

## 2019-12-28 DIAGNOSIS — J301 Allergic rhinitis due to pollen: Secondary | ICD-10-CM | POA: Diagnosis not present

## 2020-01-03 DIAGNOSIS — J3089 Other allergic rhinitis: Secondary | ICD-10-CM | POA: Diagnosis not present

## 2020-01-03 DIAGNOSIS — J301 Allergic rhinitis due to pollen: Secondary | ICD-10-CM | POA: Diagnosis not present

## 2020-01-03 DIAGNOSIS — J3081 Allergic rhinitis due to animal (cat) (dog) hair and dander: Secondary | ICD-10-CM | POA: Diagnosis not present

## 2020-01-10 DIAGNOSIS — J301 Allergic rhinitis due to pollen: Secondary | ICD-10-CM | POA: Diagnosis not present

## 2020-01-10 DIAGNOSIS — J3089 Other allergic rhinitis: Secondary | ICD-10-CM | POA: Diagnosis not present

## 2020-01-10 DIAGNOSIS — J3081 Allergic rhinitis due to animal (cat) (dog) hair and dander: Secondary | ICD-10-CM | POA: Diagnosis not present

## 2020-01-18 DIAGNOSIS — J3089 Other allergic rhinitis: Secondary | ICD-10-CM | POA: Diagnosis not present

## 2020-01-18 DIAGNOSIS — J301 Allergic rhinitis due to pollen: Secondary | ICD-10-CM | POA: Diagnosis not present

## 2020-01-18 DIAGNOSIS — J3081 Allergic rhinitis due to animal (cat) (dog) hair and dander: Secondary | ICD-10-CM | POA: Diagnosis not present

## 2020-01-20 DIAGNOSIS — J301 Allergic rhinitis due to pollen: Secondary | ICD-10-CM | POA: Diagnosis not present

## 2020-01-20 DIAGNOSIS — J3081 Allergic rhinitis due to animal (cat) (dog) hair and dander: Secondary | ICD-10-CM | POA: Diagnosis not present

## 2020-01-20 DIAGNOSIS — J3089 Other allergic rhinitis: Secondary | ICD-10-CM | POA: Diagnosis not present

## 2020-01-25 DIAGNOSIS — J3089 Other allergic rhinitis: Secondary | ICD-10-CM | POA: Diagnosis not present

## 2020-01-25 DIAGNOSIS — J301 Allergic rhinitis due to pollen: Secondary | ICD-10-CM | POA: Diagnosis not present

## 2020-01-25 DIAGNOSIS — J3081 Allergic rhinitis due to animal (cat) (dog) hair and dander: Secondary | ICD-10-CM | POA: Diagnosis not present

## 2020-02-01 DIAGNOSIS — J3089 Other allergic rhinitis: Secondary | ICD-10-CM | POA: Diagnosis not present

## 2020-02-01 DIAGNOSIS — D2262 Melanocytic nevi of left upper limb, including shoulder: Secondary | ICD-10-CM | POA: Diagnosis not present

## 2020-02-01 DIAGNOSIS — L578 Other skin changes due to chronic exposure to nonionizing radiation: Secondary | ICD-10-CM | POA: Diagnosis not present

## 2020-02-01 DIAGNOSIS — J3081 Allergic rhinitis due to animal (cat) (dog) hair and dander: Secondary | ICD-10-CM | POA: Diagnosis not present

## 2020-02-01 DIAGNOSIS — L570 Actinic keratosis: Secondary | ICD-10-CM | POA: Diagnosis not present

## 2020-02-01 DIAGNOSIS — D225 Melanocytic nevi of trunk: Secondary | ICD-10-CM | POA: Diagnosis not present

## 2020-02-01 DIAGNOSIS — J301 Allergic rhinitis due to pollen: Secondary | ICD-10-CM | POA: Diagnosis not present

## 2020-02-01 DIAGNOSIS — D485 Neoplasm of uncertain behavior of skin: Secondary | ICD-10-CM | POA: Diagnosis not present

## 2020-02-07 DIAGNOSIS — J3081 Allergic rhinitis due to animal (cat) (dog) hair and dander: Secondary | ICD-10-CM | POA: Diagnosis not present

## 2020-02-07 DIAGNOSIS — J3089 Other allergic rhinitis: Secondary | ICD-10-CM | POA: Diagnosis not present

## 2020-02-07 DIAGNOSIS — J301 Allergic rhinitis due to pollen: Secondary | ICD-10-CM | POA: Diagnosis not present

## 2020-02-14 ENCOUNTER — Telehealth (INDEPENDENT_AMBULATORY_CARE_PROVIDER_SITE_OTHER): Payer: BC Managed Care – PPO | Admitting: Family Medicine

## 2020-02-14 ENCOUNTER — Encounter: Payer: Self-pay | Admitting: Family Medicine

## 2020-02-14 VITALS — Ht 68.5 in | Wt 170.0 lb

## 2020-02-14 DIAGNOSIS — J019 Acute sinusitis, unspecified: Secondary | ICD-10-CM | POA: Diagnosis not present

## 2020-02-14 MED ORDER — AZITHROMYCIN 250 MG PO TABS: ORAL_TABLET | ORAL | 0 refills | Status: DC

## 2020-02-14 NOTE — Progress Notes (Signed)
Subjective:    Patient ID: Brent Bennett, male    DOB: 1965-09-01, 54 y.o.   MRN: [id number]  HPI Virtual Visit via Video Note  I connected with the patient on 02/14/20 at 10:15 AM EST by a video enabled telemedicine application and verified that I am speaking with the correct person using two identifiers.  Location patient: home Location provider:work or home office Persons participating in the virtual visit: patient, provider  I discussed the limitations of evaluation and management by telemedicine and the availability of in person appointments. The patient expressed understanding and agreed to proceed.   HPI: Here for 10 days of sinus pressure and blowing green mucus from the nose. No fever or ST or cough or SOB. No body aches or NVD. Using Allegra and Flonase.    ROS: See pertinent positives and negatives per HPI.  Past Medical History:  Diagnosis Date  . Allergy   . Diverticulitis 6 months ago   x2    Past Surgical History:  Procedure Laterality Date  . COLONOSCOPY  02/02/2017   per Dr. Adela Lank, diverticulae but no polyps, repeat in 10 yrs   . UMBILICAL HERNIA REPAIR  2008   with mesh     Family History  Problem Relation Age of Onset  . Lung cancer Father   . Liver disease Father   . Lung cancer Brother   . Liver disease Brother   . Colon cancer Neg Hx   . Stomach cancer Neg Hx      Current Outpatient Medications:  .  atorvastatin (LIPITOR) 10 MG tablet, Take 1 tablet (10 mg total) by mouth daily., Disp: 90 tablet, Rfl: 3 .  AUVI-Q 0.3 MG/0.3ML SOAJ injection, Inject into the muscle as directed., Disp: , Rfl:  .  azelastine (ASTELIN) 0.1 % nasal spray, Place 1 spray into both nostrils 2 (two) times daily., Disp: , Rfl:  .  BENZEPRO SHORT CONTACT 9.8 % FOAM, FOLLOW PACKAGE INSTRUCTIONS TO PRIME PUMP BEFORE 1ST USE; APPLY EXTERNALLY TO TO AFFECTED AREA S  ONCE DAILY AS DIRECTED, Disp: , Rfl: 11 .  fluticasone (FLONASE) 50 MCG/ACT nasal spray, INSTILL 2  SPRAYS INTO EACH NOSTRIL DAILY, Disp: 16 g, Rfl: 5 .  ketoconazole (NIZORAL) 2 % shampoo, Apply 1 application topically daily., Disp: , Rfl:  .  Saccharomyces boulardii (FLORASTOR PO), Take 1 tablet by mouth daily., Disp: , Rfl:  .  azithromycin (ZITHROMAX Z-PAK) 250 MG tablet, As directed, Disp: 6 each, Rfl: 0  EXAM:  VITALS per patient if applicable:  GENERAL: alert, oriented, appears well and in no acute distress  HEENT: atraumatic, conjunttiva clear, no obvious abnormalities on inspection of external nose and ears  NECK: normal movements of the head and neck  LUNGS: on inspection no signs of respiratory distress, breathing rate appears normal, no obvious gross SOB, gasping or wheezing  CV: no obvious cyanosis  MS: moves all visible extremities without noticeable abnormality  PSYCH/NEURO: pleasant and cooperative, no obvious depression or anxiety, speech and thought processing grossly intact  ASSESSMENT AND PLAN: Sinusitis, treat with a Zpack . Gershon Crane, MD  Discussed the following assessment and plan:  No diagnosis found.     I discussed the assessment and treatment plan with the patient. The patient was provided an opportunity to ask questions and all were answered. The patient agreed with the plan and demonstrated an understanding of the instructions.   The patient was advised to call back or seek an in-person evaluation if the  symptoms worsen or if the condition fails to improve as anticipated.     Review of Systems     Objective:   Physical Exam        Assessment & Plan:

## 2020-02-17 DIAGNOSIS — J3089 Other allergic rhinitis: Secondary | ICD-10-CM | POA: Diagnosis not present

## 2020-02-17 DIAGNOSIS — J3081 Allergic rhinitis due to animal (cat) (dog) hair and dander: Secondary | ICD-10-CM | POA: Diagnosis not present

## 2020-02-17 DIAGNOSIS — J301 Allergic rhinitis due to pollen: Secondary | ICD-10-CM | POA: Diagnosis not present

## 2020-02-24 DIAGNOSIS — J3081 Allergic rhinitis due to animal (cat) (dog) hair and dander: Secondary | ICD-10-CM | POA: Diagnosis not present

## 2020-02-24 DIAGNOSIS — J3089 Other allergic rhinitis: Secondary | ICD-10-CM | POA: Diagnosis not present

## 2020-02-24 DIAGNOSIS — J301 Allergic rhinitis due to pollen: Secondary | ICD-10-CM | POA: Diagnosis not present

## 2020-03-07 DIAGNOSIS — J3089 Other allergic rhinitis: Secondary | ICD-10-CM | POA: Diagnosis not present

## 2020-03-07 DIAGNOSIS — J3081 Allergic rhinitis due to animal (cat) (dog) hair and dander: Secondary | ICD-10-CM | POA: Diagnosis not present

## 2020-03-07 DIAGNOSIS — J301 Allergic rhinitis due to pollen: Secondary | ICD-10-CM | POA: Diagnosis not present

## 2020-03-19 DIAGNOSIS — J3089 Other allergic rhinitis: Secondary | ICD-10-CM | POA: Diagnosis not present

## 2020-03-19 DIAGNOSIS — J3081 Allergic rhinitis due to animal (cat) (dog) hair and dander: Secondary | ICD-10-CM | POA: Diagnosis not present

## 2020-03-19 DIAGNOSIS — J301 Allergic rhinitis due to pollen: Secondary | ICD-10-CM | POA: Diagnosis not present

## 2020-03-27 DIAGNOSIS — J3089 Other allergic rhinitis: Secondary | ICD-10-CM | POA: Diagnosis not present

## 2020-03-27 DIAGNOSIS — J301 Allergic rhinitis due to pollen: Secondary | ICD-10-CM | POA: Diagnosis not present

## 2020-03-27 DIAGNOSIS — J3081 Allergic rhinitis due to animal (cat) (dog) hair and dander: Secondary | ICD-10-CM | POA: Diagnosis not present

## 2020-04-03 ENCOUNTER — Other Ambulatory Visit: Payer: Self-pay | Admitting: Family Medicine

## 2020-04-04 DIAGNOSIS — J3089 Other allergic rhinitis: Secondary | ICD-10-CM | POA: Diagnosis not present

## 2020-04-04 DIAGNOSIS — J3081 Allergic rhinitis due to animal (cat) (dog) hair and dander: Secondary | ICD-10-CM | POA: Diagnosis not present

## 2020-04-04 DIAGNOSIS — J301 Allergic rhinitis due to pollen: Secondary | ICD-10-CM | POA: Diagnosis not present

## 2020-04-10 DIAGNOSIS — J3081 Allergic rhinitis due to animal (cat) (dog) hair and dander: Secondary | ICD-10-CM | POA: Diagnosis not present

## 2020-04-10 DIAGNOSIS — J301 Allergic rhinitis due to pollen: Secondary | ICD-10-CM | POA: Diagnosis not present

## 2020-04-10 DIAGNOSIS — J3089 Other allergic rhinitis: Secondary | ICD-10-CM | POA: Diagnosis not present

## 2020-04-18 DIAGNOSIS — J301 Allergic rhinitis due to pollen: Secondary | ICD-10-CM | POA: Diagnosis not present

## 2020-04-19 DIAGNOSIS — J301 Allergic rhinitis due to pollen: Secondary | ICD-10-CM | POA: Diagnosis not present

## 2020-04-19 DIAGNOSIS — J3081 Allergic rhinitis due to animal (cat) (dog) hair and dander: Secondary | ICD-10-CM | POA: Diagnosis not present

## 2020-04-19 DIAGNOSIS — J3089 Other allergic rhinitis: Secondary | ICD-10-CM | POA: Diagnosis not present

## 2020-05-02 DIAGNOSIS — J301 Allergic rhinitis due to pollen: Secondary | ICD-10-CM | POA: Diagnosis not present

## 2020-05-02 DIAGNOSIS — J3089 Other allergic rhinitis: Secondary | ICD-10-CM | POA: Diagnosis not present

## 2020-05-02 DIAGNOSIS — J3081 Allergic rhinitis due to animal (cat) (dog) hair and dander: Secondary | ICD-10-CM | POA: Diagnosis not present

## 2020-05-10 DIAGNOSIS — J3081 Allergic rhinitis due to animal (cat) (dog) hair and dander: Secondary | ICD-10-CM | POA: Diagnosis not present

## 2020-05-10 DIAGNOSIS — J301 Allergic rhinitis due to pollen: Secondary | ICD-10-CM | POA: Diagnosis not present

## 2020-05-10 DIAGNOSIS — J3089 Other allergic rhinitis: Secondary | ICD-10-CM | POA: Diagnosis not present

## 2020-05-21 DIAGNOSIS — J301 Allergic rhinitis due to pollen: Secondary | ICD-10-CM | POA: Diagnosis not present

## 2020-05-21 DIAGNOSIS — J3089 Other allergic rhinitis: Secondary | ICD-10-CM | POA: Diagnosis not present

## 2020-05-21 DIAGNOSIS — J3081 Allergic rhinitis due to animal (cat) (dog) hair and dander: Secondary | ICD-10-CM | POA: Diagnosis not present

## 2020-05-30 DIAGNOSIS — H1045 Other chronic allergic conjunctivitis: Secondary | ICD-10-CM | POA: Diagnosis not present

## 2020-05-30 DIAGNOSIS — J3081 Allergic rhinitis due to animal (cat) (dog) hair and dander: Secondary | ICD-10-CM | POA: Diagnosis not present

## 2020-05-30 DIAGNOSIS — J3089 Other allergic rhinitis: Secondary | ICD-10-CM | POA: Diagnosis not present

## 2020-05-30 DIAGNOSIS — J301 Allergic rhinitis due to pollen: Secondary | ICD-10-CM | POA: Diagnosis not present

## 2020-06-08 DIAGNOSIS — J301 Allergic rhinitis due to pollen: Secondary | ICD-10-CM | POA: Diagnosis not present

## 2020-06-08 DIAGNOSIS — J3089 Other allergic rhinitis: Secondary | ICD-10-CM | POA: Diagnosis not present

## 2020-06-08 DIAGNOSIS — J3081 Allergic rhinitis due to animal (cat) (dog) hair and dander: Secondary | ICD-10-CM | POA: Diagnosis not present

## 2020-06-15 DIAGNOSIS — J301 Allergic rhinitis due to pollen: Secondary | ICD-10-CM | POA: Diagnosis not present

## 2020-06-15 DIAGNOSIS — J3089 Other allergic rhinitis: Secondary | ICD-10-CM | POA: Diagnosis not present

## 2020-06-15 DIAGNOSIS — J3081 Allergic rhinitis due to animal (cat) (dog) hair and dander: Secondary | ICD-10-CM | POA: Diagnosis not present

## 2020-06-21 DIAGNOSIS — J3081 Allergic rhinitis due to animal (cat) (dog) hair and dander: Secondary | ICD-10-CM | POA: Diagnosis not present

## 2020-06-21 DIAGNOSIS — J3089 Other allergic rhinitis: Secondary | ICD-10-CM | POA: Diagnosis not present

## 2020-06-21 DIAGNOSIS — J301 Allergic rhinitis due to pollen: Secondary | ICD-10-CM | POA: Diagnosis not present

## 2020-06-28 DIAGNOSIS — J3081 Allergic rhinitis due to animal (cat) (dog) hair and dander: Secondary | ICD-10-CM | POA: Diagnosis not present

## 2020-06-28 DIAGNOSIS — J3089 Other allergic rhinitis: Secondary | ICD-10-CM | POA: Diagnosis not present

## 2020-06-28 DIAGNOSIS — J301 Allergic rhinitis due to pollen: Secondary | ICD-10-CM | POA: Diagnosis not present

## 2020-07-05 DIAGNOSIS — J3089 Other allergic rhinitis: Secondary | ICD-10-CM | POA: Diagnosis not present

## 2020-07-05 DIAGNOSIS — J301 Allergic rhinitis due to pollen: Secondary | ICD-10-CM | POA: Diagnosis not present

## 2020-07-05 DIAGNOSIS — J3081 Allergic rhinitis due to animal (cat) (dog) hair and dander: Secondary | ICD-10-CM | POA: Diagnosis not present

## 2020-07-13 DIAGNOSIS — J3081 Allergic rhinitis due to animal (cat) (dog) hair and dander: Secondary | ICD-10-CM | POA: Diagnosis not present

## 2020-07-13 DIAGNOSIS — J301 Allergic rhinitis due to pollen: Secondary | ICD-10-CM | POA: Diagnosis not present

## 2020-07-13 DIAGNOSIS — J3089 Other allergic rhinitis: Secondary | ICD-10-CM | POA: Diagnosis not present

## 2020-07-18 DIAGNOSIS — J3081 Allergic rhinitis due to animal (cat) (dog) hair and dander: Secondary | ICD-10-CM | POA: Diagnosis not present

## 2020-07-18 DIAGNOSIS — J301 Allergic rhinitis due to pollen: Secondary | ICD-10-CM | POA: Diagnosis not present

## 2020-07-18 DIAGNOSIS — J3089 Other allergic rhinitis: Secondary | ICD-10-CM | POA: Diagnosis not present

## 2020-07-23 DIAGNOSIS — J3081 Allergic rhinitis due to animal (cat) (dog) hair and dander: Secondary | ICD-10-CM | POA: Diagnosis not present

## 2020-07-23 DIAGNOSIS — J3089 Other allergic rhinitis: Secondary | ICD-10-CM | POA: Diagnosis not present

## 2020-07-23 DIAGNOSIS — J301 Allergic rhinitis due to pollen: Secondary | ICD-10-CM | POA: Diagnosis not present

## 2020-08-03 DIAGNOSIS — J301 Allergic rhinitis due to pollen: Secondary | ICD-10-CM | POA: Diagnosis not present

## 2020-08-03 DIAGNOSIS — J3081 Allergic rhinitis due to animal (cat) (dog) hair and dander: Secondary | ICD-10-CM | POA: Diagnosis not present

## 2020-08-03 DIAGNOSIS — J3089 Other allergic rhinitis: Secondary | ICD-10-CM | POA: Diagnosis not present

## 2020-08-10 DIAGNOSIS — J3081 Allergic rhinitis due to animal (cat) (dog) hair and dander: Secondary | ICD-10-CM | POA: Diagnosis not present

## 2020-08-10 DIAGNOSIS — J3089 Other allergic rhinitis: Secondary | ICD-10-CM | POA: Diagnosis not present

## 2020-08-10 DIAGNOSIS — J301 Allergic rhinitis due to pollen: Secondary | ICD-10-CM | POA: Diagnosis not present

## 2020-08-17 DIAGNOSIS — J3081 Allergic rhinitis due to animal (cat) (dog) hair and dander: Secondary | ICD-10-CM | POA: Diagnosis not present

## 2020-08-17 DIAGNOSIS — J301 Allergic rhinitis due to pollen: Secondary | ICD-10-CM | POA: Diagnosis not present

## 2020-08-17 DIAGNOSIS — J3089 Other allergic rhinitis: Secondary | ICD-10-CM | POA: Diagnosis not present

## 2020-08-24 DIAGNOSIS — J301 Allergic rhinitis due to pollen: Secondary | ICD-10-CM | POA: Diagnosis not present

## 2020-08-24 DIAGNOSIS — J3089 Other allergic rhinitis: Secondary | ICD-10-CM | POA: Diagnosis not present

## 2020-08-24 DIAGNOSIS — J3081 Allergic rhinitis due to animal (cat) (dog) hair and dander: Secondary | ICD-10-CM | POA: Diagnosis not present

## 2020-08-31 DIAGNOSIS — J3089 Other allergic rhinitis: Secondary | ICD-10-CM | POA: Diagnosis not present

## 2020-08-31 DIAGNOSIS — J301 Allergic rhinitis due to pollen: Secondary | ICD-10-CM | POA: Diagnosis not present

## 2020-08-31 DIAGNOSIS — J3081 Allergic rhinitis due to animal (cat) (dog) hair and dander: Secondary | ICD-10-CM | POA: Diagnosis not present

## 2020-09-06 DIAGNOSIS — J3089 Other allergic rhinitis: Secondary | ICD-10-CM | POA: Diagnosis not present

## 2020-09-06 DIAGNOSIS — J3081 Allergic rhinitis due to animal (cat) (dog) hair and dander: Secondary | ICD-10-CM | POA: Diagnosis not present

## 2020-09-06 DIAGNOSIS — J301 Allergic rhinitis due to pollen: Secondary | ICD-10-CM | POA: Diagnosis not present

## 2020-09-13 DIAGNOSIS — J301 Allergic rhinitis due to pollen: Secondary | ICD-10-CM | POA: Diagnosis not present

## 2020-09-13 DIAGNOSIS — J3081 Allergic rhinitis due to animal (cat) (dog) hair and dander: Secondary | ICD-10-CM | POA: Diagnosis not present

## 2020-09-13 DIAGNOSIS — J3089 Other allergic rhinitis: Secondary | ICD-10-CM | POA: Diagnosis not present

## 2020-09-26 DIAGNOSIS — J3089 Other allergic rhinitis: Secondary | ICD-10-CM | POA: Diagnosis not present

## 2020-09-26 DIAGNOSIS — J3081 Allergic rhinitis due to animal (cat) (dog) hair and dander: Secondary | ICD-10-CM | POA: Diagnosis not present

## 2020-09-26 DIAGNOSIS — J301 Allergic rhinitis due to pollen: Secondary | ICD-10-CM | POA: Diagnosis not present

## 2020-10-04 ENCOUNTER — Encounter: Payer: Self-pay | Admitting: Family Medicine

## 2020-10-10 DIAGNOSIS — J3081 Allergic rhinitis due to animal (cat) (dog) hair and dander: Secondary | ICD-10-CM | POA: Diagnosis not present

## 2020-10-10 DIAGNOSIS — J301 Allergic rhinitis due to pollen: Secondary | ICD-10-CM | POA: Diagnosis not present

## 2020-10-10 DIAGNOSIS — J3089 Other allergic rhinitis: Secondary | ICD-10-CM | POA: Diagnosis not present

## 2020-10-23 DIAGNOSIS — J301 Allergic rhinitis due to pollen: Secondary | ICD-10-CM | POA: Diagnosis not present

## 2020-10-23 DIAGNOSIS — J3089 Other allergic rhinitis: Secondary | ICD-10-CM | POA: Diagnosis not present

## 2020-10-23 DIAGNOSIS — J3081 Allergic rhinitis due to animal (cat) (dog) hair and dander: Secondary | ICD-10-CM | POA: Diagnosis not present

## 2020-10-29 DIAGNOSIS — J3081 Allergic rhinitis due to animal (cat) (dog) hair and dander: Secondary | ICD-10-CM | POA: Diagnosis not present

## 2020-10-29 DIAGNOSIS — J301 Allergic rhinitis due to pollen: Secondary | ICD-10-CM | POA: Diagnosis not present

## 2020-10-29 DIAGNOSIS — J3089 Other allergic rhinitis: Secondary | ICD-10-CM | POA: Diagnosis not present

## 2020-11-01 ENCOUNTER — Other Ambulatory Visit: Payer: Self-pay

## 2020-11-01 ENCOUNTER — Encounter: Payer: Self-pay | Admitting: Family Medicine

## 2020-11-01 ENCOUNTER — Telehealth (INDEPENDENT_AMBULATORY_CARE_PROVIDER_SITE_OTHER): Payer: BC Managed Care – PPO | Admitting: Family Medicine

## 2020-11-01 DIAGNOSIS — U071 COVID-19: Secondary | ICD-10-CM | POA: Diagnosis not present

## 2020-11-01 MED ORDER — MOLNUPIRAVIR EUA 200MG CAPSULE: 4.0000 | ORAL_CAPSULE | Freq: Two times a day (BID) | ORAL | 0 refills | Status: AC

## 2020-11-01 NOTE — Progress Notes (Signed)
   Subjective:    Patient ID: Brent Bennett, male    DOB: 1966/01/11, 55 y.o.   MRN: [id number]  HPI Virtual Visit via Telephone Note  I connected with the patient on 11/01/20 at  8:45 AM EDT by telephone and verified that I am speaking with the correct person using two identifiers.   I discussed the limitations, risks, security and privacy concerns of performing an evaluation and management service by telephone and the availability of in person appointments. I also discussed with the patient that there may be a patient responsible charge related to this service. The patient expressed understanding and agreed to proceed.  Location patient: home Location provider: work or home office Participants present for the call: patient, provider Patient did not have a visit in the prior 7 days to address this/these issue(s).   History of Present Illness: Here for a Covid-19 infection. 3 days ago he began to have chest congestion and a ST. Then last night he developed body aches and fever to 101 degrees. He has a dry cough but no SOB. No NVD. Drinking fluids and taking Tylenol.   Observations/Objective: Patient sounds cheerful and well on the phone. I do not appreciate any SOB. Speech and thought processing are grossly intact. Patient reported vitals:  Assessment and Plan: Covid-19 infection. Treat with Molnupiravir for 5 days. Quarantine for 5 days. Gershon Crane, MD   Follow Up Instructions:     [phone] [id number] 11-20 [id number] 21-30 I did not refer this patient for an OV in the next 24 hours for this/these issue(s).  I discussed the assessment and treatment plan with the patient. The patient was provided an opportunity to ask questions and all were answered. The patient agreed with the plan and demonstrated an understanding of the instructions.   The patient was advised to call back or seek an in-person evaluation if the symptoms worsen or if the condition fails to improve as  anticipated.  I provided 14 minutes of non-face-to-face time during this encounter.   Gershon Crane, MD     Review of Systems     Objective:   Physical Exam        Assessment & Plan:

## 2020-11-12 DIAGNOSIS — J301 Allergic rhinitis due to pollen: Secondary | ICD-10-CM | POA: Diagnosis not present

## 2020-11-12 DIAGNOSIS — J3081 Allergic rhinitis due to animal (cat) (dog) hair and dander: Secondary | ICD-10-CM | POA: Diagnosis not present

## 2020-11-12 DIAGNOSIS — J3089 Other allergic rhinitis: Secondary | ICD-10-CM | POA: Diagnosis not present

## 2020-11-15 DIAGNOSIS — J3081 Allergic rhinitis due to animal (cat) (dog) hair and dander: Secondary | ICD-10-CM | POA: Diagnosis not present

## 2020-11-15 DIAGNOSIS — J301 Allergic rhinitis due to pollen: Secondary | ICD-10-CM | POA: Diagnosis not present

## 2020-11-15 DIAGNOSIS — J3089 Other allergic rhinitis: Secondary | ICD-10-CM | POA: Diagnosis not present

## 2020-11-20 ENCOUNTER — Encounter: Payer: BC Managed Care – PPO | Admitting: Family Medicine

## 2020-11-20 DIAGNOSIS — L700 Acne vulgaris: Secondary | ICD-10-CM | POA: Diagnosis not present

## 2020-11-26 ENCOUNTER — Ambulatory Visit (INDEPENDENT_AMBULATORY_CARE_PROVIDER_SITE_OTHER): Payer: BC Managed Care – PPO | Admitting: Family Medicine

## 2020-11-26 ENCOUNTER — Other Ambulatory Visit: Payer: Self-pay

## 2020-11-26 ENCOUNTER — Encounter: Payer: Self-pay | Admitting: Family Medicine

## 2020-11-26 VITALS — BP 118/78 | HR 86 | Temp 98.1°F | Ht 68.25 in | Wt 174.2 lb

## 2020-11-26 DIAGNOSIS — J301 Allergic rhinitis due to pollen: Secondary | ICD-10-CM | POA: Diagnosis not present

## 2020-11-26 DIAGNOSIS — Z0000 Encounter for general adult medical examination without abnormal findings: Secondary | ICD-10-CM | POA: Diagnosis not present

## 2020-11-26 DIAGNOSIS — J3089 Other allergic rhinitis: Secondary | ICD-10-CM | POA: Diagnosis not present

## 2020-11-26 DIAGNOSIS — Z125 Encounter for screening for malignant neoplasm of prostate: Secondary | ICD-10-CM | POA: Diagnosis not present

## 2020-11-26 DIAGNOSIS — J3081 Allergic rhinitis due to animal (cat) (dog) hair and dander: Secondary | ICD-10-CM | POA: Diagnosis not present

## 2020-11-26 LAB — T4, FREE: Free T4: 0.68 ng/dL (ref 0.60–1.60)

## 2020-11-26 LAB — CBC WITH DIFFERENTIAL/PLATELET
Basophils Absolute: 0 10*3/uL (ref 0.0–0.1)
Basophils Relative: 0.3 % (ref 0.0–3.0)
Eosinophils Absolute: 0.1 10*3/uL (ref 0.0–0.7)
Eosinophils Relative: 3.3 % (ref 0.0–5.0)
HCT: 44 % (ref 39.0–52.0)
Hemoglobin: 15.2 g/dL (ref 13.0–17.0)
Lymphocytes Relative: 29.5 % (ref 12.0–46.0)
Lymphs Abs: 1.2 10*3/uL (ref 0.7–4.0)
MCHC: 34.4 g/dL (ref 30.0–36.0)
MCV: 90 fl (ref 78.0–100.0)
Monocytes Absolute: 0.3 10*3/uL (ref 0.1–1.0)
Monocytes Relative: 8 % (ref 3.0–12.0)
Neutro Abs: 2.3 10*3/uL (ref 1.4–7.7)
Neutrophils Relative %: 58.9 % (ref 43.0–77.0)
Platelets: 224 10*3/uL (ref 150.0–400.0)
RBC: 4.9 Mil/uL (ref 4.22–5.81)
RDW: 12.8 % (ref 11.5–15.5)
WBC: 3.9 10*3/uL — ABNORMAL LOW (ref 4.0–10.5)

## 2020-11-26 LAB — BASIC METABOLIC PANEL
BUN: 17 mg/dL (ref 6–23)
CO2: 30 mEq/L (ref 19–32)
Calcium: 9.3 mg/dL (ref 8.4–10.5)
Chloride: 103 mEq/L (ref 96–112)
Creatinine, Ser: 0.93 mg/dL (ref 0.40–1.50)
GFR: 92.71 mL/min (ref >60.00)
Glucose, Bld: 100 mg/dL — ABNORMAL HIGH (ref 70–99)
Potassium: 4.4 mEq/L (ref 3.5–5.1)
Sodium: 140 mEq/L (ref 135–145)

## 2020-11-26 LAB — LIPID PANEL
Cholesterol: 184 mg/dL (ref 0–200)
HDL: 56.4 mg/dL (ref >39.00)
LDL Cholesterol: 105 mg/dL — ABNORMAL HIGH (ref 0–99)
NonHDL: 127.32
Total CHOL/HDL Ratio: 3
Triglycerides: 113 mg/dL (ref 0.0–149.0)
VLDL: 22.6 mg/dL (ref 0.0–40.0)

## 2020-11-26 LAB — HEPATIC FUNCTION PANEL
ALT: 19 U/L (ref 0–53)
AST: 20 U/L (ref 0–37)
Albumin: 4.4 g/dL (ref 3.5–5.2)
Alkaline Phosphatase: 72 U/L (ref 39–117)
Bilirubin, Direct: 0.2 mg/dL (ref 0.0–0.3)
Total Bilirubin: 1.5 mg/dL — ABNORMAL HIGH (ref 0.2–1.2)
Total Protein: 7.2 g/dL (ref 6.0–8.3)

## 2020-11-26 LAB — T3, FREE: T3, Free: 3.2 pg/mL (ref 2.3–4.2)

## 2020-11-26 LAB — HEMOGLOBIN A1C: Hgb A1c MFr Bld: 5.5 % (ref 4.6–6.5)

## 2020-11-26 LAB — TSH: TSH: 1.78 u[IU]/mL (ref 0.35–5.50)

## 2020-11-26 LAB — PSA: PSA: 1.77 ng/mL (ref 0.10–4.00)

## 2020-11-26 NOTE — Progress Notes (Signed)
Subjective:    Patient ID: Brent Bennett, male    DOB: [birth year], 55 y.o.   MRN: [id number]  HPI Here for a well exam. He feels well except for chronic nasal congestion which causes him to snore at night. He sees Dr. Fruita Callas for his allergies, and he has recommended Kathlene November see Dr. Margaree Mackintosh for surgery to correct his deviated septum. He asks our opinion.    Review of Systems  Constitutional: Negative.   HENT:  Positive for congestion.   Eyes: Negative.   Respiratory: Negative.    Cardiovascular: Negative.   Gastrointestinal: Negative.   Genitourinary: Negative.   Musculoskeletal: Negative.   Skin: Negative.   Neurological: Negative.   Psychiatric/Behavioral: Negative.        Objective:   Physical Exam Constitutional:      General: He is not in acute distress.    Appearance: Normal appearance. He is well-developed. He is not diaphoretic.  HENT:     Head: Normocephalic and atraumatic.     Right Ear: External ear normal.     Left Ear: External ear normal.     Nose:     Comments: His nasal septum is quite deviated and th right naris is essentially closed     Mouth/Throat:     Pharynx: No oropharyngeal exudate.  Eyes:     General: No scleral icterus.       Right eye: No discharge.        Left eye: No discharge.     Conjunctiva/sclera: Conjunctivae normal.     Pupils: Pupils are equal, round, and reactive to light.  Neck:     Thyroid: No thyromegaly.     Vascular: No JVD.     Trachea: No tracheal deviation.  Cardiovascular:     Rate and Rhythm: Normal rate and regular rhythm.     Heart sounds: Normal heart sounds. No murmur heard.   No friction rub. No gallop.  Pulmonary:     Effort: Pulmonary effort is normal. No respiratory distress.     Breath sounds: Normal breath sounds. No wheezing or rales.  Chest:     Chest wall: No tenderness.  Abdominal:     General: Bowel sounds are normal. There is no distension.     Palpations: Abdomen is soft. There is no mass.      Tenderness: There is no abdominal tenderness. There is no guarding or rebound.  Genitourinary:    Penis: Normal. No tenderness.      Testes: Normal.     Prostate: Normal.     Rectum: Normal. Guaiac result negative.  Musculoskeletal:        General: No tenderness. Normal range of motion.     Cervical back: Neck supple.  Lymphadenopathy:     Cervical: No cervical adenopathy.  Skin:    General: Skin is warm and dry.     Coloration: Skin is not pale.     Findings: No erythema or rash.  Neurological:     Mental Status: He is alert and oriented to person, place, and time.     Cranial Nerves: No cranial nerve deficit.     Motor: No abnormal muscle tone.     Coordination: Coordination normal.     Deep Tendon Reflexes: Reflexes are normal and symmetric. Reflexes normal.  Psychiatric:        Behavior: Behavior normal.        Thought Content: Thought content normal.        Judgment: Judgment normal.  Assessment & Plan:  Well exam. We discussed diet and exercise. Get fasting labs. I agreed that he would be a good candidate for nasal surgery. Gershon Crane, MD

## 2020-11-29 ENCOUNTER — Telehealth: Payer: Self-pay | Admitting: Family Medicine

## 2020-11-29 MED ORDER — AZITHROMYCIN 250 MG PO TABS: ORAL_TABLET | ORAL | 0 refills | Status: DC

## 2020-11-29 NOTE — Telephone Encounter (Signed)
Done

## 2020-11-30 DIAGNOSIS — J301 Allergic rhinitis due to pollen: Secondary | ICD-10-CM | POA: Diagnosis not present

## 2020-11-30 DIAGNOSIS — J3089 Other allergic rhinitis: Secondary | ICD-10-CM | POA: Diagnosis not present

## 2020-11-30 DIAGNOSIS — J3081 Allergic rhinitis due to animal (cat) (dog) hair and dander: Secondary | ICD-10-CM | POA: Diagnosis not present

## 2020-12-04 ENCOUNTER — Encounter: Payer: Self-pay | Admitting: Family Medicine

## 2020-12-04 ENCOUNTER — Telehealth (INDEPENDENT_AMBULATORY_CARE_PROVIDER_SITE_OTHER): Payer: BC Managed Care – PPO | Admitting: Family Medicine

## 2020-12-04 DIAGNOSIS — J0191 Acute recurrent sinusitis, unspecified: Secondary | ICD-10-CM | POA: Diagnosis not present

## 2020-12-04 MED ORDER — LEVOFLOXACIN 500 MG PO TABS: 500.0000 mg | ORAL_TABLET | Freq: Every day | ORAL | 0 refills | Status: AC

## 2020-12-04 NOTE — Progress Notes (Signed)
Subjective:    Patient ID: Brent Bennett, male    DOB: [birth year], 55 y.o.   MRN: [id number]  HPI Virtual Visit via Video Note  I connected with the patient on 12/04/20 at  1:30 PM EDT by a video enabled telemedicine application and verified that I am speaking with the correct person using two identifiers.  Location patient: home Location provider:work or home office Persons participating in the virtual visit: patient, provider  I discussed the limitations of evaluation and management by telemedicine and the availability of in person appointments. The patient expressed understanding and agreed to proceed.   HPI: Here for a persistent sinus infection. He has had sinus pressure with PND and a dry morning cough for the past 10 days. No fever or SOB or NVD. He has tested negative for the Covid-19 virus 3 times during this period. He took a Zpack, but this did not help at all. He is taking Allegra D BID.    ROS: See pertinent positives and negatives per HPI.  Past Medical History:  Diagnosis Date   Allergy    Diverticulitis 6 months ago   x2    Past Surgical History:  Procedure Laterality Date   COLONOSCOPY  02/02/2017   per Dr. Adela Lank, diverticulae but no polyps, repeat in 10 yrs    UMBILICAL HERNIA REPAIR  2008   with mesh     Family History  Problem Relation Age of Onset   Lung cancer Father    Liver disease Father    Lung cancer Brother    Liver disease Brother    Colon cancer Neg Hx    Stomach cancer Neg Hx      Current Outpatient Medications:    atorvastatin (LIPITOR) 10 MG tablet, TAKE 1 TABLET(10 MG) BY MOUTH DAILY, Disp: 90 tablet, Rfl: 3   AUVI-Q 0.3 MG/0.3ML SOAJ injection, Inject into the muscle as directed., Disp: , Rfl:    azelastine (ASTELIN) 0.1 % nasal spray, Place 1 spray into both nostrils 2 (two) times daily., Disp: , Rfl:    BENZEPRO SHORT CONTACT 9.8 % FOAM, FOLLOW PACKAGE INSTRUCTIONS TO PRIME PUMP BEFORE 1ST USE; APPLY EXTERNALLY TO TO  AFFECTED AREA S  ONCE DAILY AS DIRECTED, Disp: , Rfl: 11   fluticasone (FLONASE) 50 MCG/ACT nasal spray, INSTILL 2 SPRAYS INTO EACH NOSTRIL DAILY, Disp: 16 g, Rfl: 5   ketoconazole (NIZORAL) 2 % shampoo, Apply 1 application topically daily., Disp: , Rfl:    levofloxacin (LEVAQUIN) 500 MG tablet, Take 1 tablet (500 mg total) by mouth daily for 10 days., Disp: 10 tablet, Rfl: 0   Saccharomyces boulardii (FLORASTOR PO), Take 1 tablet by mouth daily., Disp: , Rfl:   EXAM:  VITALS per patient if applicable:  GENERAL: alert, oriented, appears well and in no acute distress  HEENT: atraumatic, conjunttiva clear, no obvious abnormalities on inspection of external nose and ears  NECK: normal movements of the head and neck  LUNGS: on inspection no signs of respiratory distress, breathing rate appears normal, no obvious gross SOB, gasping or wheezing  CV: no obvious cyanosis  MS: moves all visible extremities without noticeable abnormality  PSYCH/NEURO: pleasant and cooperative, no obvious depression or anxiety, speech and thought processing grossly intact  ASSESSMENT AND PLAN: Sinusitis, treat with 10 days of Levaquin. Recheck as needed.  Gershon Crane, MD  Discussed the following assessment and plan:  No diagnosis found.     I discussed the assessment and treatment plan with the patient. The  patient was provided an opportunity to ask questions and all were answered. The patient agreed with the plan and demonstrated an understanding of the instructions.   The patient was advised to call back or seek an in-person evaluation if the symptoms worsen or if the condition fails to improve as anticipated.      Review of Systems     Objective:   Physical Exam        Assessment & Plan:

## 2020-12-05 DIAGNOSIS — J301 Allergic rhinitis due to pollen: Secondary | ICD-10-CM | POA: Diagnosis not present

## 2020-12-05 DIAGNOSIS — J3081 Allergic rhinitis due to animal (cat) (dog) hair and dander: Secondary | ICD-10-CM | POA: Diagnosis not present

## 2020-12-05 DIAGNOSIS — J3089 Other allergic rhinitis: Secondary | ICD-10-CM | POA: Diagnosis not present

## 2020-12-14 DIAGNOSIS — J3089 Other allergic rhinitis: Secondary | ICD-10-CM | POA: Diagnosis not present

## 2020-12-14 DIAGNOSIS — J3081 Allergic rhinitis due to animal (cat) (dog) hair and dander: Secondary | ICD-10-CM | POA: Diagnosis not present

## 2020-12-14 DIAGNOSIS — J301 Allergic rhinitis due to pollen: Secondary | ICD-10-CM | POA: Diagnosis not present

## 2020-12-19 DIAGNOSIS — J3081 Allergic rhinitis due to animal (cat) (dog) hair and dander: Secondary | ICD-10-CM | POA: Diagnosis not present

## 2020-12-19 DIAGNOSIS — J3089 Other allergic rhinitis: Secondary | ICD-10-CM | POA: Diagnosis not present

## 2020-12-19 DIAGNOSIS — J301 Allergic rhinitis due to pollen: Secondary | ICD-10-CM | POA: Diagnosis not present

## 2020-12-31 DIAGNOSIS — J301 Allergic rhinitis due to pollen: Secondary | ICD-10-CM | POA: Diagnosis not present

## 2020-12-31 DIAGNOSIS — J3081 Allergic rhinitis due to animal (cat) (dog) hair and dander: Secondary | ICD-10-CM | POA: Diagnosis not present

## 2020-12-31 DIAGNOSIS — J3089 Other allergic rhinitis: Secondary | ICD-10-CM | POA: Diagnosis not present

## 2021-01-09 DIAGNOSIS — J3081 Allergic rhinitis due to animal (cat) (dog) hair and dander: Secondary | ICD-10-CM | POA: Diagnosis not present

## 2021-01-09 DIAGNOSIS — J301 Allergic rhinitis due to pollen: Secondary | ICD-10-CM | POA: Diagnosis not present

## 2021-01-09 DIAGNOSIS — J3089 Other allergic rhinitis: Secondary | ICD-10-CM | POA: Diagnosis not present

## 2021-01-14 DIAGNOSIS — J3089 Other allergic rhinitis: Secondary | ICD-10-CM | POA: Diagnosis not present

## 2021-01-14 DIAGNOSIS — J301 Allergic rhinitis due to pollen: Secondary | ICD-10-CM | POA: Diagnosis not present

## 2021-01-14 DIAGNOSIS — J3081 Allergic rhinitis due to animal (cat) (dog) hair and dander: Secondary | ICD-10-CM | POA: Diagnosis not present

## 2021-01-24 DIAGNOSIS — J3089 Other allergic rhinitis: Secondary | ICD-10-CM | POA: Diagnosis not present

## 2021-01-24 DIAGNOSIS — J3081 Allergic rhinitis due to animal (cat) (dog) hair and dander: Secondary | ICD-10-CM | POA: Diagnosis not present

## 2021-01-24 DIAGNOSIS — J301 Allergic rhinitis due to pollen: Secondary | ICD-10-CM | POA: Diagnosis not present

## 2021-02-01 DIAGNOSIS — J3081 Allergic rhinitis due to animal (cat) (dog) hair and dander: Secondary | ICD-10-CM | POA: Diagnosis not present

## 2021-02-01 DIAGNOSIS — J3089 Other allergic rhinitis: Secondary | ICD-10-CM | POA: Diagnosis not present

## 2021-02-01 DIAGNOSIS — J301 Allergic rhinitis due to pollen: Secondary | ICD-10-CM | POA: Diagnosis not present

## 2021-02-11 DIAGNOSIS — J3081 Allergic rhinitis due to animal (cat) (dog) hair and dander: Secondary | ICD-10-CM | POA: Diagnosis not present

## 2021-02-11 DIAGNOSIS — J3089 Other allergic rhinitis: Secondary | ICD-10-CM | POA: Diagnosis not present

## 2021-02-11 DIAGNOSIS — J301 Allergic rhinitis due to pollen: Secondary | ICD-10-CM | POA: Diagnosis not present

## 2021-02-13 DIAGNOSIS — D225 Melanocytic nevi of trunk: Secondary | ICD-10-CM | POA: Diagnosis not present

## 2021-02-13 DIAGNOSIS — L570 Actinic keratosis: Secondary | ICD-10-CM | POA: Diagnosis not present

## 2021-02-13 DIAGNOSIS — D485 Neoplasm of uncertain behavior of skin: Secondary | ICD-10-CM | POA: Diagnosis not present

## 2021-02-13 DIAGNOSIS — L739 Follicular disorder, unspecified: Secondary | ICD-10-CM | POA: Diagnosis not present

## 2021-02-13 DIAGNOSIS — L578 Other skin changes due to chronic exposure to nonionizing radiation: Secondary | ICD-10-CM | POA: Diagnosis not present

## 2021-02-13 DIAGNOSIS — L821 Other seborrheic keratosis: Secondary | ICD-10-CM | POA: Diagnosis not present

## 2021-03-11 DIAGNOSIS — J3089 Other allergic rhinitis: Secondary | ICD-10-CM | POA: Diagnosis not present

## 2021-03-11 DIAGNOSIS — J3081 Allergic rhinitis due to animal (cat) (dog) hair and dander: Secondary | ICD-10-CM | POA: Diagnosis not present

## 2021-03-11 DIAGNOSIS — J301 Allergic rhinitis due to pollen: Secondary | ICD-10-CM | POA: Diagnosis not present

## 2021-03-21 DIAGNOSIS — J301 Allergic rhinitis due to pollen: Secondary | ICD-10-CM | POA: Diagnosis not present

## 2021-03-21 DIAGNOSIS — J3081 Allergic rhinitis due to animal (cat) (dog) hair and dander: Secondary | ICD-10-CM | POA: Diagnosis not present

## 2021-03-21 DIAGNOSIS — J3089 Other allergic rhinitis: Secondary | ICD-10-CM | POA: Diagnosis not present

## 2021-03-26 DIAGNOSIS — J301 Allergic rhinitis due to pollen: Secondary | ICD-10-CM | POA: Diagnosis not present

## 2021-03-26 DIAGNOSIS — J3089 Other allergic rhinitis: Secondary | ICD-10-CM | POA: Diagnosis not present

## 2021-03-26 DIAGNOSIS — J3081 Allergic rhinitis due to animal (cat) (dog) hair and dander: Secondary | ICD-10-CM | POA: Diagnosis not present

## 2021-04-04 DIAGNOSIS — J3081 Allergic rhinitis due to animal (cat) (dog) hair and dander: Secondary | ICD-10-CM | POA: Diagnosis not present

## 2021-04-04 DIAGNOSIS — J301 Allergic rhinitis due to pollen: Secondary | ICD-10-CM | POA: Diagnosis not present

## 2021-04-04 DIAGNOSIS — J3089 Other allergic rhinitis: Secondary | ICD-10-CM | POA: Diagnosis not present

## 2021-04-05 DIAGNOSIS — J3089 Other allergic rhinitis: Secondary | ICD-10-CM | POA: Diagnosis not present

## 2021-04-05 DIAGNOSIS — J3081 Allergic rhinitis due to animal (cat) (dog) hair and dander: Secondary | ICD-10-CM | POA: Diagnosis not present

## 2021-04-13 ENCOUNTER — Other Ambulatory Visit: Payer: Self-pay | Admitting: Family Medicine

## 2021-04-18 DIAGNOSIS — J3081 Allergic rhinitis due to animal (cat) (dog) hair and dander: Secondary | ICD-10-CM | POA: Diagnosis not present

## 2021-04-18 DIAGNOSIS — J301 Allergic rhinitis due to pollen: Secondary | ICD-10-CM | POA: Diagnosis not present

## 2021-04-18 DIAGNOSIS — J3089 Other allergic rhinitis: Secondary | ICD-10-CM | POA: Diagnosis not present

## 2021-04-25 DIAGNOSIS — J301 Allergic rhinitis due to pollen: Secondary | ICD-10-CM | POA: Diagnosis not present

## 2021-04-25 DIAGNOSIS — J3081 Allergic rhinitis due to animal (cat) (dog) hair and dander: Secondary | ICD-10-CM | POA: Diagnosis not present

## 2021-04-25 DIAGNOSIS — J3089 Other allergic rhinitis: Secondary | ICD-10-CM | POA: Diagnosis not present

## 2021-05-07 DIAGNOSIS — J3089 Other allergic rhinitis: Secondary | ICD-10-CM | POA: Diagnosis not present

## 2021-05-07 DIAGNOSIS — J301 Allergic rhinitis due to pollen: Secondary | ICD-10-CM | POA: Diagnosis not present

## 2021-05-07 DIAGNOSIS — J3081 Allergic rhinitis due to animal (cat) (dog) hair and dander: Secondary | ICD-10-CM | POA: Diagnosis not present

## 2021-05-14 DIAGNOSIS — J3089 Other allergic rhinitis: Secondary | ICD-10-CM | POA: Diagnosis not present

## 2021-05-14 DIAGNOSIS — J301 Allergic rhinitis due to pollen: Secondary | ICD-10-CM | POA: Diagnosis not present

## 2021-05-14 DIAGNOSIS — J3081 Allergic rhinitis due to animal (cat) (dog) hair and dander: Secondary | ICD-10-CM | POA: Diagnosis not present

## 2021-05-23 DIAGNOSIS — J3081 Allergic rhinitis due to animal (cat) (dog) hair and dander: Secondary | ICD-10-CM | POA: Diagnosis not present

## 2021-05-23 DIAGNOSIS — J301 Allergic rhinitis due to pollen: Secondary | ICD-10-CM | POA: Diagnosis not present

## 2021-05-23 DIAGNOSIS — J3089 Other allergic rhinitis: Secondary | ICD-10-CM | POA: Diagnosis not present

## 2021-05-31 DIAGNOSIS — J301 Allergic rhinitis due to pollen: Secondary | ICD-10-CM | POA: Diagnosis not present

## 2021-05-31 DIAGNOSIS — J3081 Allergic rhinitis due to animal (cat) (dog) hair and dander: Secondary | ICD-10-CM | POA: Diagnosis not present

## 2021-05-31 DIAGNOSIS — J3089 Other allergic rhinitis: Secondary | ICD-10-CM | POA: Diagnosis not present

## 2021-06-05 DIAGNOSIS — J301 Allergic rhinitis due to pollen: Secondary | ICD-10-CM | POA: Diagnosis not present

## 2021-06-05 DIAGNOSIS — J3081 Allergic rhinitis due to animal (cat) (dog) hair and dander: Secondary | ICD-10-CM | POA: Diagnosis not present

## 2021-06-05 DIAGNOSIS — H1045 Other chronic allergic conjunctivitis: Secondary | ICD-10-CM | POA: Diagnosis not present

## 2021-06-05 DIAGNOSIS — J3089 Other allergic rhinitis: Secondary | ICD-10-CM | POA: Diagnosis not present

## 2021-06-17 DIAGNOSIS — J3089 Other allergic rhinitis: Secondary | ICD-10-CM | POA: Diagnosis not present

## 2021-06-17 DIAGNOSIS — J3081 Allergic rhinitis due to animal (cat) (dog) hair and dander: Secondary | ICD-10-CM | POA: Diagnosis not present

## 2021-06-17 DIAGNOSIS — J301 Allergic rhinitis due to pollen: Secondary | ICD-10-CM | POA: Diagnosis not present

## 2021-06-24 DIAGNOSIS — J3089 Other allergic rhinitis: Secondary | ICD-10-CM | POA: Diagnosis not present

## 2021-06-24 DIAGNOSIS — J301 Allergic rhinitis due to pollen: Secondary | ICD-10-CM | POA: Diagnosis not present

## 2021-06-24 DIAGNOSIS — J3081 Allergic rhinitis due to animal (cat) (dog) hair and dander: Secondary | ICD-10-CM | POA: Diagnosis not present

## 2021-07-04 DIAGNOSIS — J301 Allergic rhinitis due to pollen: Secondary | ICD-10-CM | POA: Diagnosis not present

## 2021-07-04 DIAGNOSIS — J3089 Other allergic rhinitis: Secondary | ICD-10-CM | POA: Diagnosis not present

## 2021-07-04 DIAGNOSIS — J3081 Allergic rhinitis due to animal (cat) (dog) hair and dander: Secondary | ICD-10-CM | POA: Diagnosis not present

## 2021-07-15 DIAGNOSIS — J3081 Allergic rhinitis due to animal (cat) (dog) hair and dander: Secondary | ICD-10-CM | POA: Diagnosis not present

## 2021-07-15 DIAGNOSIS — J3089 Other allergic rhinitis: Secondary | ICD-10-CM | POA: Diagnosis not present

## 2021-07-15 DIAGNOSIS — J301 Allergic rhinitis due to pollen: Secondary | ICD-10-CM | POA: Diagnosis not present

## 2021-07-25 ENCOUNTER — Other Ambulatory Visit: Payer: Self-pay | Admitting: Family Medicine

## 2021-07-26 DIAGNOSIS — J301 Allergic rhinitis due to pollen: Secondary | ICD-10-CM | POA: Diagnosis not present

## 2021-07-26 DIAGNOSIS — J3081 Allergic rhinitis due to animal (cat) (dog) hair and dander: Secondary | ICD-10-CM | POA: Diagnosis not present

## 2021-07-26 DIAGNOSIS — J3089 Other allergic rhinitis: Secondary | ICD-10-CM | POA: Diagnosis not present

## 2021-08-01 ENCOUNTER — Other Ambulatory Visit: Payer: Self-pay | Admitting: Family Medicine

## 2021-08-05 DIAGNOSIS — J3089 Other allergic rhinitis: Secondary | ICD-10-CM | POA: Diagnosis not present

## 2021-08-05 DIAGNOSIS — J301 Allergic rhinitis due to pollen: Secondary | ICD-10-CM | POA: Diagnosis not present

## 2021-08-05 DIAGNOSIS — J3081 Allergic rhinitis due to animal (cat) (dog) hair and dander: Secondary | ICD-10-CM | POA: Diagnosis not present

## 2021-08-23 ENCOUNTER — Telehealth: Payer: Self-pay | Admitting: Family Medicine

## 2021-08-23 DIAGNOSIS — J3089 Other allergic rhinitis: Secondary | ICD-10-CM | POA: Diagnosis not present

## 2021-08-23 DIAGNOSIS — J3081 Allergic rhinitis due to animal (cat) (dog) hair and dander: Secondary | ICD-10-CM | POA: Diagnosis not present

## 2021-08-23 DIAGNOSIS — J301 Allergic rhinitis due to pollen: Secondary | ICD-10-CM | POA: Diagnosis not present

## 2021-08-23 NOTE — Telephone Encounter (Signed)
Pt notified that Dr Clent Ridges is out of the office until Monday; that if he wanted abx he would have to be seen by a provider. Pt verb understanding. Advised that he can try any OTC decongestant since he does not have HTN; he may also choose to do an evisit through [employer]. Pt verb understanding.

## 2021-08-23 NOTE — Telephone Encounter (Signed)
Pt requesting a z pak for sinus pressure/congestion. Sneezing, post nasal, clear mucus x 1 day. Offered ov,declined

## 2021-08-24 ENCOUNTER — Telehealth: Payer: BC Managed Care – PPO | Admitting: Nurse Practitioner

## 2021-08-24 ENCOUNTER — Telehealth: Payer: BC Managed Care – PPO | Admitting: Physician Assistant

## 2021-08-24 ENCOUNTER — Encounter: Payer: Self-pay | Admitting: Physician Assistant

## 2021-08-24 DIAGNOSIS — B9689 Other specified bacterial agents as the cause of diseases classified elsewhere: Secondary | ICD-10-CM | POA: Diagnosis not present

## 2021-08-24 DIAGNOSIS — J019 Acute sinusitis, unspecified: Secondary | ICD-10-CM

## 2021-08-24 DIAGNOSIS — Z91199 Patient's noncompliance with other medical treatment and regimen due to unspecified reason: Secondary | ICD-10-CM

## 2021-08-24 MED ORDER — FLUTICASONE PROPIONATE 50 MCG/ACT NA SUSP: 2.0000 | Freq: Every day | NASAL | 0 refills | Status: DC

## 2021-08-24 MED ORDER — AZITHROMYCIN 250 MG PO TABS: ORAL_TABLET | ORAL | 0 refills | Status: AC

## 2021-08-24 NOTE — Patient Instructions (Signed)
  [name], thank you for joining Gildardo Pounds, NP for today's virtual visit.  While this provider is not your primary care provider (PCP), if your PCP is located in our provider database this encounter information will be shared with them immediately following your visit.  Consent: (Patient) [name] provided verbal consent for this virtual visit at the beginning of the encounter.  Current Medications:  Current Outpatient Medications:    azithromycin (ZITHROMAX) 250 MG tablet, Take 2 tablets on day 1, then 1 tablet daily on days 2 through 5, Disp: 6 tablet, Rfl: 0   atorvastatin (LIPITOR) 10 MG tablet, TAKE 1 TABLET(10 MG) BY MOUTH DAILY, Disp: 90 tablet, Rfl: 0   AUVI-Q 0.3 MG/0.3ML SOAJ injection, Inject into the muscle as directed., Disp: , Rfl:    azelastine (ASTELIN) 0.1 % nasal spray, Place 1 spray into both nostrils 2 (two) times daily., Disp: , Rfl:    BENZEPRO SHORT CONTACT 9.8 % FOAM, FOLLOW PACKAGE INSTRUCTIONS TO PRIME PUMP BEFORE 1ST USE; APPLY EXTERNALLY TO TO AFFECTED AREA S  ONCE DAILY AS DIRECTED, Disp: , Rfl: 11   fluticasone (FLONASE) 50 MCG/ACT nasal spray, Place 2 sprays into both nostrils daily., Disp: 16 g, Rfl: 0   ketoconazole (NIZORAL) 2 % shampoo, Apply 1 application topically daily., Disp: , Rfl:    Saccharomyces boulardii (FLORASTOR PO), Take 1 tablet by mouth daily., Disp: , Rfl:    Medications ordered in this encounter:  Meds ordered this encounter  Medications   fluticasone (FLONASE) 50 MCG/ACT nasal spray    Sig: Place 2 sprays into both nostrils daily.    Dispense:  16 g    Refill:  0    Order Specific Question:   Supervising Provider    Answer:   MILLER, BRIAN [[id number]]   azithromycin (ZITHROMAX) 250 MG tablet    Sig: Take 2 tablets on day 1, then 1 tablet daily on days 2 through 5    Dispense:  6 tablet    Refill:  0    Order Specific Question:   Supervising Provider    Answer:   Noemi Chapel [[id number]]     *If you need refills  on other medications prior to your next appointment, please contact your pharmacy*  Follow-Up: Call back or seek an in-person evaluation if the symptoms worsen or if the condition fails to improve as anticipated.  Other Instructions INSTRUCTIONS: use a humidifier for nasal congestion Drink plenty of fluids, rest and wash hands frequently to avoid the spread of infection Alternate tylenol and Motrin for relief of fever    If you have been instructed to have an in-person evaluation today at a local Urgent Care facility, please use the link below. It will take you to a list of all of our available [facility], including address, phone number and hours of operation. Please do not delay care.  [facility]  If you or a family member do not have a primary care provider, use the link below to schedule a visit and establish care. When you choose a [facility] primary care physician or advanced practice provider, you gain a long-term partner in health. Find a Primary Care Provider  Learn more about 's in-office and virtual care options: [facility]

## 2021-08-24 NOTE — Progress Notes (Signed)
Virtual Visit Consent   Brent Bennett, you are scheduled for a virtual visit with a [facility] provider today. Just as with appointments in the office, your consent must be obtained to participate. Your consent will be active for this visit and any virtual visit you may have with one of our providers in the next 365 days. If you have a MyChart account, a copy of this consent can be sent to you electronically.  As this is a virtual visit, video technology does not allow for your provider to perform a traditional examination. This may limit your provider's ability to fully assess your condition. If your provider identifies any concerns that need to be evaluated in person or the need to arrange testing (such as labs, EKG, etc.), we will make arrangements to do so. Although advances in technology are sophisticated, we cannot ensure that it will always work on either your end or our end. If the connection with a video visit is poor, the visit may have to be switched to a telephone visit. With either a video or telephone visit, we are not always able to ensure that we have a secure connection.  By engaging in this virtual visit, you consent to the provision of healthcare and authorize for your insurance to be billed (if applicable) for the services provided during this visit. Depending on your insurance coverage, you may receive a charge related to this service.  I need to obtain your verbal consent now. Are you willing to proceed with your visit today? Brent Bennett has provided verbal consent on 08/24/2021 for a virtual visit (video or telephone). Brent Rigg, NP  Date: 08/24/2021 2:45 PM  Virtual Visit via Video Note   I, Brent Bennett, connected with  Brent Bennett  ([id number], 12-Aug-1965) on 08/24/21 at  2:30 PM EDT by a video-enabled telemedicine application and verified that I am speaking with the correct person using two identifiers.  Location: Patient: Virtual Visit Location  Patient: Mobile Provider: Virtual Visit Location Provider: Home Office   I discussed the limitations of evaluation and management by telemedicine and the availability of in person appointments. The patient expressed understanding and agreed to proceed.    History of Present Illness: Brent Bennett is a 56 y.o. who identifies as a male who was assigned male at birth, and is being seen today for Sinusitis.  Pertinent PMH: on immunotherapy for allergies, has deviated nasal septum and asthma.  Today he notes several days onset of PND, sinus congestion and pressure, frontal headache, cough, sore throat, rhinorrhea with purulent nasal drainage, and malaise. Taking allegra D which has been ineffective. He does note current symptoms are similar to previous sinus infection symptoms.   Problems:  Patient Active Problem List   Diagnosis Date Noted   COVID-19 virus infection 11/01/2020   Deviated nasal septum 05/24/2019   Dyslipidemia 03/20/2017   Asthma 05/10/2007   Allergic rhinitis 12/23/2006    Allergies:  Allergies  Allergen Reactions   Penicillins Other (See Comments)    Reaction-unknown   Medications:  Current Outpatient Medications:    azithromycin (ZITHROMAX) 250 MG tablet, Take 2 tablets on day 1, then 1 tablet daily on days 2 through 5, Disp: 6 tablet, Rfl: 0   atorvastatin (LIPITOR) 10 MG tablet, TAKE 1 TABLET(10 MG) BY MOUTH DAILY, Disp: 90 tablet, Rfl: 0   AUVI-Q 0.3 MG/0.3ML SOAJ injection, Inject into the muscle as directed., Disp: , Rfl:    azelastine (ASTELIN) 0.1 %  nasal spray, Place 1 spray into both nostrils 2 (two) times daily., Disp: , Rfl:    BENZEPRO SHORT CONTACT 9.8 % FOAM, FOLLOW PACKAGE INSTRUCTIONS TO PRIME PUMP BEFORE 1ST USE; APPLY EXTERNALLY TO TO AFFECTED AREA S  ONCE DAILY AS DIRECTED, Disp: , Rfl: 11   fluticasone (FLONASE) 50 MCG/ACT nasal spray, Place 2 sprays into both nostrils daily., Disp: 16 g, Rfl: 0   ketoconazole (NIZORAL) 2 % shampoo, Apply 1  application topically daily., Disp: , Rfl:    Saccharomyces boulardii (FLORASTOR PO), Take 1 tablet by mouth daily., Disp: , Rfl:   Observations/Objective: Patient is well-developed, well-nourished in no acute distress.  Resting comfortably  at home.  Head is normocephalic, atraumatic.  No labored breathing.  Speech is clear and coherent with logical content.  Patient is alert and oriented at baseline.    Assessment and Plan: 1. Acute bacterial sinusitis - fluticasone (FLONASE) 50 MCG/ACT nasal spray; Place 2 sprays into both nostrils daily.  Dispense: 16 g; Refill: 0 - azithromycin (ZITHROMAX) 250 MG tablet; Take 2 tablets on day 1, then 1 tablet daily on days 2 through 5  Dispense: 6 tablet; Refill: 0 INSTRUCTIONS: use a humidifier for nasal congestion Drink plenty of fluids, rest and wash hands frequently to avoid the spread of infection Alternate tylenol and Motrin for relief of fever   Follow Up Instructions: I discussed the assessment and treatment plan with the patient. The patient was provided an opportunity to ask questions and all were answered. The patient agreed with the plan and demonstrated an understanding of the instructions.  A copy of instructions were sent to the patient via MyChart unless otherwise noted below.    The patient was advised to call back or seek an in-person evaluation if the symptoms worsen or if the condition fails to improve as anticipated.  Time:  I spent 11 minutes with the patient via telehealth technology discussing the above problems/concerns.    Brent Rigg, NP

## 2021-08-24 NOTE — Progress Notes (Signed)
Patient was scheduled for Virtual Urgent care visit today at 1:30p for sinusitis.  This provider sent direct links for virtual visit and called patient via telephone and left a voicemail. On voicemail I encouraged patient if appt was still needed to reschedule at his convenience.  I did not see patient -- no charge.  Jarold Motto PA-C

## 2021-09-03 DIAGNOSIS — L570 Actinic keratosis: Secondary | ICD-10-CM | POA: Diagnosis not present

## 2021-09-05 DIAGNOSIS — J3081 Allergic rhinitis due to animal (cat) (dog) hair and dander: Secondary | ICD-10-CM | POA: Diagnosis not present

## 2021-09-05 DIAGNOSIS — J3089 Other allergic rhinitis: Secondary | ICD-10-CM | POA: Diagnosis not present

## 2021-09-05 DIAGNOSIS — J301 Allergic rhinitis due to pollen: Secondary | ICD-10-CM | POA: Diagnosis not present

## 2021-09-13 DIAGNOSIS — J301 Allergic rhinitis due to pollen: Secondary | ICD-10-CM | POA: Diagnosis not present

## 2021-09-13 DIAGNOSIS — J3089 Other allergic rhinitis: Secondary | ICD-10-CM | POA: Diagnosis not present

## 2021-09-13 DIAGNOSIS — J3081 Allergic rhinitis due to animal (cat) (dog) hair and dander: Secondary | ICD-10-CM | POA: Diagnosis not present

## 2021-09-18 DIAGNOSIS — J3089 Other allergic rhinitis: Secondary | ICD-10-CM | POA: Diagnosis not present

## 2021-09-18 DIAGNOSIS — J301 Allergic rhinitis due to pollen: Secondary | ICD-10-CM | POA: Diagnosis not present

## 2021-09-18 DIAGNOSIS — J3081 Allergic rhinitis due to animal (cat) (dog) hair and dander: Secondary | ICD-10-CM | POA: Diagnosis not present

## 2021-09-30 DIAGNOSIS — J3089 Other allergic rhinitis: Secondary | ICD-10-CM | POA: Diagnosis not present

## 2021-09-30 DIAGNOSIS — J301 Allergic rhinitis due to pollen: Secondary | ICD-10-CM | POA: Diagnosis not present

## 2021-09-30 DIAGNOSIS — J3081 Allergic rhinitis due to animal (cat) (dog) hair and dander: Secondary | ICD-10-CM | POA: Diagnosis not present

## 2021-10-10 DIAGNOSIS — J301 Allergic rhinitis due to pollen: Secondary | ICD-10-CM | POA: Diagnosis not present

## 2021-10-10 DIAGNOSIS — J3089 Other allergic rhinitis: Secondary | ICD-10-CM | POA: Diagnosis not present

## 2021-10-10 DIAGNOSIS — J3081 Allergic rhinitis due to animal (cat) (dog) hair and dander: Secondary | ICD-10-CM | POA: Diagnosis not present

## 2021-10-15 DIAGNOSIS — J3089 Other allergic rhinitis: Secondary | ICD-10-CM | POA: Diagnosis not present

## 2021-10-15 DIAGNOSIS — J3081 Allergic rhinitis due to animal (cat) (dog) hair and dander: Secondary | ICD-10-CM | POA: Diagnosis not present

## 2021-10-15 DIAGNOSIS — J301 Allergic rhinitis due to pollen: Secondary | ICD-10-CM | POA: Diagnosis not present

## 2021-10-28 DIAGNOSIS — J301 Allergic rhinitis due to pollen: Secondary | ICD-10-CM | POA: Diagnosis not present

## 2021-10-28 DIAGNOSIS — J3081 Allergic rhinitis due to animal (cat) (dog) hair and dander: Secondary | ICD-10-CM | POA: Diagnosis not present

## 2021-10-28 DIAGNOSIS — J3089 Other allergic rhinitis: Secondary | ICD-10-CM | POA: Diagnosis not present

## 2021-10-29 ENCOUNTER — Other Ambulatory Visit: Payer: Self-pay | Admitting: Family Medicine

## 2021-10-31 DIAGNOSIS — D225 Melanocytic nevi of trunk: Secondary | ICD-10-CM | POA: Diagnosis not present

## 2021-10-31 DIAGNOSIS — L578 Other skin changes due to chronic exposure to nonionizing radiation: Secondary | ICD-10-CM | POA: Diagnosis not present

## 2021-10-31 DIAGNOSIS — L570 Actinic keratosis: Secondary | ICD-10-CM | POA: Diagnosis not present

## 2021-10-31 DIAGNOSIS — D485 Neoplasm of uncertain behavior of skin: Secondary | ICD-10-CM | POA: Diagnosis not present

## 2021-10-31 DIAGNOSIS — L821 Other seborrheic keratosis: Secondary | ICD-10-CM | POA: Diagnosis not present

## 2021-10-31 DIAGNOSIS — D0439 Carcinoma in situ of skin of other parts of face: Secondary | ICD-10-CM | POA: Diagnosis not present

## 2021-11-06 DIAGNOSIS — J3089 Other allergic rhinitis: Secondary | ICD-10-CM | POA: Diagnosis not present

## 2021-11-06 DIAGNOSIS — J3081 Allergic rhinitis due to animal (cat) (dog) hair and dander: Secondary | ICD-10-CM | POA: Diagnosis not present

## 2021-11-06 DIAGNOSIS — J301 Allergic rhinitis due to pollen: Secondary | ICD-10-CM | POA: Diagnosis not present

## 2021-11-12 DIAGNOSIS — J3081 Allergic rhinitis due to animal (cat) (dog) hair and dander: Secondary | ICD-10-CM | POA: Diagnosis not present

## 2021-11-12 DIAGNOSIS — J3089 Other allergic rhinitis: Secondary | ICD-10-CM | POA: Diagnosis not present

## 2021-11-12 DIAGNOSIS — J301 Allergic rhinitis due to pollen: Secondary | ICD-10-CM | POA: Diagnosis not present

## 2021-11-21 DIAGNOSIS — D0430 Carcinoma in situ of skin of unspecified part of face: Secondary | ICD-10-CM | POA: Diagnosis not present

## 2021-11-27 DIAGNOSIS — J3089 Other allergic rhinitis: Secondary | ICD-10-CM | POA: Diagnosis not present

## 2021-11-27 DIAGNOSIS — J3081 Allergic rhinitis due to animal (cat) (dog) hair and dander: Secondary | ICD-10-CM | POA: Diagnosis not present

## 2021-11-27 DIAGNOSIS — J301 Allergic rhinitis due to pollen: Secondary | ICD-10-CM | POA: Diagnosis not present

## 2021-11-28 ENCOUNTER — Encounter: Payer: Self-pay | Admitting: Family Medicine

## 2021-11-28 ENCOUNTER — Ambulatory Visit (INDEPENDENT_AMBULATORY_CARE_PROVIDER_SITE_OTHER): Payer: BC Managed Care – PPO | Admitting: Family Medicine

## 2021-11-28 VITALS — BP 120/70 | HR 63 | Temp 97.8°F | Ht 68.25 in | Wt 176.2 lb

## 2021-11-28 DIAGNOSIS — Z0000 Encounter for general adult medical examination without abnormal findings: Secondary | ICD-10-CM

## 2021-11-28 LAB — CBC WITH DIFFERENTIAL/PLATELET
Basophils Absolute: 0 10*3/uL (ref 0.0–0.1)
Basophils Relative: 0.7 % (ref 0.0–3.0)
Eosinophils Absolute: 0.2 10*3/uL (ref 0.0–0.7)
Eosinophils Relative: 3 % (ref 0.0–5.0)
HCT: 47.4 % (ref 39.0–52.0)
Hemoglobin: 15.7 g/dL (ref 13.0–17.0)
Lymphocytes Relative: 26.6 % (ref 12.0–46.0)
Lymphs Abs: 1.4 10*3/uL (ref 0.7–4.0)
MCHC: 33.1 g/dL (ref 30.0–36.0)
MCV: 91.5 fl (ref 78.0–100.0)
Monocytes Absolute: 0.4 10*3/uL (ref 0.1–1.0)
Monocytes Relative: 8.5 % (ref 3.0–12.0)
Neutro Abs: 3.2 10*3/uL (ref 1.4–7.7)
Neutrophils Relative %: 61.2 % (ref 43.0–77.0)
Platelets: 230 10*3/uL (ref 150.0–400.0)
RBC: 5.18 Mil/uL (ref 4.22–5.81)
RDW: 12.8 % (ref 11.5–15.5)
WBC: 5.3 10*3/uL (ref 4.0–10.5)

## 2021-11-28 LAB — BASIC METABOLIC PANEL
BUN: 13 mg/dL (ref 6–23)
CO2: 30 mEq/L (ref 19–32)
Calcium: 9.4 mg/dL (ref 8.4–10.5)
Chloride: 101 mEq/L (ref 96–112)
Creatinine, Ser: 0.89 mg/dL (ref 0.40–1.50)
GFR: 96.08 mL/min (ref >60.00)
Glucose, Bld: 101 mg/dL — ABNORMAL HIGH (ref 70–99)
Potassium: 4.2 mEq/L (ref 3.5–5.1)
Sodium: 138 mEq/L (ref 135–145)

## 2021-11-28 LAB — LIPID PANEL
Cholesterol: 180 mg/dL (ref 0–200)
HDL: 63.8 mg/dL (ref >39.00)
LDL Cholesterol: 93 mg/dL (ref 0–99)
NonHDL: 116.45
Total CHOL/HDL Ratio: 3
Triglycerides: 119 mg/dL (ref 0.0–149.0)
VLDL: 23.8 mg/dL (ref 0.0–40.0)

## 2021-11-28 LAB — HEPATIC FUNCTION PANEL
ALT: 22 U/L (ref 0–53)
AST: 25 U/L (ref 0–37)
Albumin: 4.5 g/dL (ref 3.5–5.2)
Alkaline Phosphatase: 65 U/L (ref 39–117)
Bilirubin, Direct: 0.3 mg/dL (ref 0.0–0.3)
Total Bilirubin: 2.2 mg/dL — ABNORMAL HIGH (ref 0.2–1.2)
Total Protein: 7.7 g/dL (ref 6.0–8.3)

## 2021-11-28 LAB — HEMOGLOBIN A1C: Hgb A1c MFr Bld: 5.7 % (ref 4.6–6.5)

## 2021-11-28 LAB — TSH: TSH: 2.58 u[IU]/mL (ref 0.35–5.50)

## 2021-11-28 MED ORDER — AZELASTINE HCL 0.1 % NA SOLN
1.0000 | Freq: Two times a day (BID) | NASAL | 11 refills | Status: AC
Start: 2021-11-28 — End: ?

## 2021-11-28 NOTE — Progress Notes (Signed)
Subjective:    Patient ID: CHADRIC KIMBERLEY, male    DOB: 12-25-1965, 56 y.o.   MRN: [id number]  HPI Here for a well exam. He feels well except he mentions his chronic nasal congestion. He also snores loudly. He has seen Dr. Suszanne Conners for ENT care, and he knows he has a deviated septum.    Review of Systems  Constitutional: Negative.   HENT:  Positive for congestion.   Eyes: Negative.   Respiratory: Negative.    Cardiovascular: Negative.   Gastrointestinal: Negative.   Genitourinary: Negative.   Musculoskeletal: Negative.   Skin: Negative.   Neurological: Negative.   Psychiatric/Behavioral: Negative.         Objective:   Physical Exam Constitutional:      General: He is not in acute distress.    Appearance: Normal appearance. He is well-developed. He is not diaphoretic.  HENT:     Head: Normocephalic and atraumatic.     Right Ear: External ear normal.     Left Ear: External ear normal.     Nose: Nose normal.     Mouth/Throat:     Pharynx: No oropharyngeal exudate.  Eyes:     General: No scleral icterus.       Right eye: No discharge.        Left eye: No discharge.     Conjunctiva/sclera: Conjunctivae normal.     Pupils: Pupils are equal, round, and reactive to light.  Neck:     Thyroid: No thyromegaly.     Vascular: No JVD.     Trachea: No tracheal deviation.  Cardiovascular:     Rate and Rhythm: Normal rate and regular rhythm.     Heart sounds: Normal heart sounds. No murmur heard.    No friction rub. No gallop.  Pulmonary:     Effort: Pulmonary effort is normal. No respiratory distress.     Breath sounds: Normal breath sounds. No wheezing or rales.  Chest:     Chest wall: No tenderness.  Abdominal:     General: Bowel sounds are normal. There is no distension.     Palpations: Abdomen is soft. There is no mass.     Tenderness: There is no abdominal tenderness. There is no guarding or rebound.  Genitourinary:    Penis: Normal. No tenderness.      Testes:  Normal.     Prostate: Normal.     Rectum: Normal. Guaiac result negative.  Musculoskeletal:        General: No tenderness. Normal range of motion.     Cervical back: Neck supple.  Lymphadenopathy:     Cervical: No cervical adenopathy.  Skin:    General: Skin is warm and dry.     Coloration: Skin is not pale.     Findings: No erythema or rash.  Neurological:     Mental Status: He is alert and oriented to person, place, and time.     Cranial Nerves: No cranial nerve deficit.     Motor: No abnormal muscle tone.     Coordination: Coordination normal.     Deep Tendon Reflexes: Reflexes are normal and symmetric. Reflexes normal.  Psychiatric:        Behavior: Behavior normal.        Thought Content: Thought content normal.        Judgment: Judgment normal.           Assessment & Plan:  Well exam. We discussed diet and exercise. Get fasting labs. I  encouraged him to follow up with Dr. Suszanne Conners and to ask him for a sleep study. I think it is very likely that Kathlene November has sleep apnea. Gershon Crane, MD

## 2021-12-03 DIAGNOSIS — J3089 Other allergic rhinitis: Secondary | ICD-10-CM | POA: Diagnosis not present

## 2021-12-03 DIAGNOSIS — J3081 Allergic rhinitis due to animal (cat) (dog) hair and dander: Secondary | ICD-10-CM | POA: Diagnosis not present

## 2021-12-03 DIAGNOSIS — J301 Allergic rhinitis due to pollen: Secondary | ICD-10-CM | POA: Diagnosis not present

## 2021-12-24 DIAGNOSIS — J3081 Allergic rhinitis due to animal (cat) (dog) hair and dander: Secondary | ICD-10-CM | POA: Diagnosis not present

## 2021-12-24 DIAGNOSIS — J3089 Other allergic rhinitis: Secondary | ICD-10-CM | POA: Diagnosis not present

## 2021-12-24 DIAGNOSIS — J301 Allergic rhinitis due to pollen: Secondary | ICD-10-CM | POA: Diagnosis not present

## 2021-12-31 DIAGNOSIS — J3089 Other allergic rhinitis: Secondary | ICD-10-CM | POA: Diagnosis not present

## 2021-12-31 DIAGNOSIS — J3081 Allergic rhinitis due to animal (cat) (dog) hair and dander: Secondary | ICD-10-CM | POA: Diagnosis not present

## 2021-12-31 DIAGNOSIS — J301 Allergic rhinitis due to pollen: Secondary | ICD-10-CM | POA: Diagnosis not present

## 2022-01-01 ENCOUNTER — Telehealth: Payer: Self-pay | Admitting: Family Medicine

## 2022-01-01 DIAGNOSIS — G4730 Sleep apnea, unspecified: Secondary | ICD-10-CM

## 2022-01-01 NOTE — Telephone Encounter (Signed)
Pt is calling back because he stated he talk with dr.Fry about a sleep study and he want to have one also he want a call back to let him know.

## 2022-01-02 NOTE — Telephone Encounter (Signed)
Spoke with patient message given referral placed.

## 2022-01-02 NOTE — Telephone Encounter (Signed)
I did the referral 

## 2022-01-09 ENCOUNTER — Encounter: Payer: Self-pay | Admitting: Primary Care

## 2022-01-09 ENCOUNTER — Ambulatory Visit (INDEPENDENT_AMBULATORY_CARE_PROVIDER_SITE_OTHER): Payer: BC Managed Care – PPO | Admitting: Primary Care

## 2022-01-09 VITALS — BP 118/82 | HR 70 | Temp 98.3°F | Ht 69.0 in | Wt 178.4 lb

## 2022-01-09 DIAGNOSIS — J45909 Unspecified asthma, uncomplicated: Secondary | ICD-10-CM | POA: Diagnosis not present

## 2022-01-09 DIAGNOSIS — J3081 Allergic rhinitis due to animal (cat) (dog) hair and dander: Secondary | ICD-10-CM | POA: Diagnosis not present

## 2022-01-09 DIAGNOSIS — J342 Deviated nasal septum: Secondary | ICD-10-CM | POA: Diagnosis not present

## 2022-01-09 DIAGNOSIS — J301 Allergic rhinitis due to pollen: Secondary | ICD-10-CM | POA: Diagnosis not present

## 2022-01-09 DIAGNOSIS — J3089 Other allergic rhinitis: Secondary | ICD-10-CM | POA: Diagnosis not present

## 2022-01-09 DIAGNOSIS — R0683 Snoring: Secondary | ICD-10-CM

## 2022-01-09 NOTE — Progress Notes (Signed)
Reviewed and agree with assessment/plan.   Neelie Welshans, MD [facility] 01/09/2022, 11:25 AM Pager:  [phone]  

## 2022-01-09 NOTE — Patient Instructions (Addendum)
Sleep apnea is defined as period of 10 seconds or longer when you stop breathing at night. This can happen multiple times a night. Dx sleep apnea is when this occurs more than 5 times an hour.    Mild OSA 5-15 apneic events an hour Moderate OSA 15-30 apneic events an hour Severe OSA > 30 apneic events an hour   Untreated sleep apnea puts you at higher risk for cardiac arrhythmias, pulmonary HTN, stroke and diabetes   Treatment options include weight loss, side sleeping position, oral appliance, CPAP therapy or referral to ENT for possible surgical options    Recommendations: Focus on side sleeping position or elevate head with wedge pillow 30 degrees Do not drive if experiencing excessive daytime sleepiness of fatigue  Ok to continue taking melatonin  Maintain normal BMI range    Orders: Home sleep study re: loud snoring (ordered)   Follow-up: Please call to schedule follow-up 1-2 weeks after completing home sleep study to review results and treatment if needed (can be virtual)   Sleep Apnea Sleep apnea affects breathing during sleep. It causes breathing to stop for 10 seconds or more, or to become shallow. People with sleep apnea usually snore loudly. It can also increase the risk of: Heart attack. Stroke. Being very overweight (obese). Diabetes. Heart failure. Irregular heartbeat. High blood pressure. The goal of treatment is to help you breathe normally again. What are the causes?  The most common cause of this condition is a collapsed or blocked airway. There are three kinds of sleep apnea: Obstructive sleep apnea. This is caused by a blocked or collapsed airway. Central sleep apnea. This happens when the brain does not send the right signals to the muscles that control breathing. Mixed sleep apnea. This is a combination of obstructive and central sleep apnea. What increases the risk? Being overweight. Smoking. Having a small airway. Being older. Being  male. Drinking alcohol. Taking medicines to calm yourself (sedatives or tranquilizers). Having family members with the condition. Having a tongue or tonsils that are larger than normal. What are the signs or symptoms? Trouble staying asleep. Loud snoring. Headaches in the morning. Waking up gasping. Dry mouth or sore throat in the morning. Being sleepy or tired during the day. If you are sleepy or tired during the day, you may also: Not be able to focus your mind (concentrate). Forget things. Get angry a lot and have mood swings. Feel sad (depressed). Have changes in your personality. Have less interest in sex, if you are male. Be unable to have an erection, if you are male. How is this treated?  Sleeping on your side. Using a medicine to get rid of mucus in your nose (decongestant). Avoiding the use of alcohol, medicines to help you relax, or certain pain medicines (narcotics). Losing weight, if needed. Changing your diet. Quitting smoking. Using a machine to open your airway while you sleep, such as: An oral appliance. This is a mouthpiece that shifts your lower jaw forward. A CPAP device. This device blows air through a mask when you breathe out (exhale). An EPAP device. This has valves that you put in each nostril. A BIPAP device. This device blows air through a mask when you breathe in (inhale) and breathe out. Having surgery if other treatments do not work. Follow these instructions at home: Lifestyle Make changes that your doctor recommends. Eat a healthy diet. Lose weight if needed. Avoid alcohol, medicines to help you relax, and some pain medicines. Do not smoke or  use any products that contain nicotine or tobacco. If you need help quitting, ask your doctor. General instructions Take over-the-counter and prescription medicines only as told by your doctor. If you were given a machine to use while you sleep, use it only as told by your doctor. If you are having  surgery, make sure to tell your doctor you have sleep apnea. You may need to bring your device with you. Keep all follow-up visits. Contact a doctor if: The machine that you were given to use during sleep bothers you or does not seem to be working. You do not get better. You get worse. Get help right away if: Your chest hurts. You have trouble breathing in enough air. You have an uncomfortable feeling in your back, arms, or stomach. You have trouble talking. One side of your body feels weak. A part of your face is hanging down. These symptoms may be an emergency. Get help right away. Call your local emergency services (911 in the [place] Do not wait to see if the symptoms will go away. Do not drive yourself to the hospital. Summary This condition affects breathing during sleep. The most common cause is a collapsed or blocked airway. The goal of treatment is to help you breathe normally while you sleep. This information is not intended to replace advice given to you by your health care provider. Make sure you discuss any questions you have with your health care provider. Document Revised: 10/31/2020 Document Reviewed: 03/02/2020 Elsevier Patient Education  [place].

## 2022-01-09 NOTE — Assessment & Plan Note (Addendum)
-   Patient has symptoms of loud snoring. Epworth score is 10.  BMI 26.  Suspicion patient could have underlying obstructive sleep apnea, needs home sleep study to evaluate.  We discussed risks of untreated sleep apnea including cardiac arrhythmias, pulmonary hypertension, diabetes and stroke.  We also reviewed treatment options including weight loss, oral appliance, CPAP therapy or referral to ENT for possible surgical options.  Encourage patient focus on side sleeping position or elevate head of bed 30 degrees.  Advised against driving experiencing excessive daytime sleepiness or fatigue.  Follow-up 1 to 2 weeks after sleep study to review results and treatment options if needed.

## 2022-01-09 NOTE — Assessment & Plan Note (Signed)
-   Follows with Dr. Donneta Romberg

## 2022-01-09 NOTE — Progress Notes (Signed)
@Patient  ID: Brent Bennett, male    DOB: May 22, 1965, 56 y.o.   MRN: [id number]  Chief Complaint  Patient presents with   Consult    Referring provider: Nelwyn Salisbury, MD  HPI: 56 year old male, never smoked.  Past medical history significant for allergic rhinitis, asthma, deviated nasal septum, COVID-19, dyslipidemia.  01/09/2022 Patient presents today for sleep. Accompanied by his wife.  Patient has symptoms of loud snoring.  His snoring interfere with his wife's sleep. This been going on for several years.  He is not particularly disrupted by his sleep.  He does have a deviated nasal septum and has been told that he needs surgery, he is currently holding off on this. His typical bedtime is between 10 and 10:30 PM.  He takes 10 mg of melatonin. It takes him on average 30 minutes. He wakes up between 1-2 times a night to use the restroom. He starts his day between 7 and 8 AM.  His weight has remained stable.  No previous sleep studies.  He does not wear CPAP or oxygen.  Epworth score is 10.  Denies symptoms of narcolepsy, cataplexy or sleepwalking.  Sleep questionnaire Symptoms- Snoring symptoms    Prior sleep study- None  Bedtime- 10-10:30pm Time to fall asleep- 30 mins Nocturnal awakenings- 1-2 times Out of bed/start of day- 7-8am Weight changes- stable Do you operate heavy machinery- no Do you currently wear CPAP- no Do you current wear oxygen- no Epworth- 10 Medications- 10mg  melatonin     Allergies  Allergen Reactions   Penicillins Other (See Comments)    Reaction-unknown    Immunization History  Administered Date(s) Administered   Influenza,inj,Quad PF,6+ Mos 03/02/2017   Influenza-Unspecified 02/20/2015   PFIZER(Purple Top)SARS-COV-2 Vaccination 06/20/2019, 07/11/2019   Tdap 06/26/2015    Past Medical History:  Diagnosis Date   Allergy    Diverticulitis 6 months ago   x2    Tobacco History: Social History   Tobacco Use  Smoking Status Never    Passive exposure: Past  Smokeless Tobacco Never   Counseling given: Not Answered   Outpatient Medications Prior to Visit  Medication Sig Dispense Refill   atorvastatin (LIPITOR) 10 MG tablet TAKE 1 TABLET(10 MG) BY MOUTH DAILY 90 tablet 0   AUVI-Q 0.3 MG/0.3ML SOAJ injection Inject into the muscle as directed.     azelastine (ASTELIN) 0.1 % nasal spray Place 1 spray into both nostrils 2 (two) times daily. 30 mL 11   fluticasone (FLONASE) 50 MCG/ACT nasal spray Place 2 sprays into both nostrils daily. 16 g 0   ketoconazole (NIZORAL) 2 % shampoo Apply 1 application topically daily.     BENZEPRO SHORT CONTACT 9.8 % FOAM FOLLOW PACKAGE INSTRUCTIONS TO PRIME PUMP BEFORE 1ST USE; APPLY EXTERNALLY TO TO AFFECTED AREA S  ONCE DAILY AS DIRECTED  11   Saccharomyces boulardii (FLORASTOR PO) Take 1 tablet by mouth daily.     No facility-administered medications prior to visit.   Review of Systems  Review of Systems  Constitutional: Negative.   Respiratory: Negative.    Cardiovascular: Negative.   Psychiatric/Behavioral:  Positive for sleep disturbance.    Physical Exam  BP 118/82 (BP Location: Right Arm, Patient Position: Sitting, Cuff Size: Normal)   Pulse 70   Temp 98.3 F (36.8 C) (Oral)   Ht 5\' 9"  (1.753 m)   Wt 178 lb 6.4 oz (80.9 kg)   SpO2 99%   BMI 26.35 kg/m  Physical Exam Constitutional:  Appearance: Normal appearance.  HENT:     Head: Normocephalic and atraumatic.     Nose:     Comments: Deviated nasal septum to right     Mouth/Throat:     Mouth: Mucous membranes are moist.     Pharynx: Oropharynx is clear.  Cardiovascular:     Rate and Rhythm: Normal rate and regular rhythm.  Pulmonary:     Effort: Pulmonary effort is normal.     Breath sounds: Normal breath sounds.  Musculoskeletal:        General: Normal range of motion.  Skin:    General: Skin is warm and dry.  Neurological:     General: No focal deficit present.     Mental Status: He is alert and  oriented to person, place, and time. Mental status is at baseline.  Psychiatric:        Mood and Affect: Mood normal.        Behavior: Behavior normal.        Thought Content: Thought content normal.        Judgment: Judgment normal.      Lab Results:  CBC    Component Value Date/Time   WBC 5.3 11/28/2021 0951   RBC 5.18 11/28/2021 0951   HGB 15.7 11/28/2021 0951   HCT 47.4 11/28/2021 0951   PLT 230.0 11/28/2021 0951   MCV 91.5 11/28/2021 0951   MCHC 33.1 11/28/2021 0951   RDW 12.8 11/28/2021 0951   LYMPHSABS 1.4 11/28/2021 0951   MONOABS 0.4 11/28/2021 0951   EOSABS 0.2 11/28/2021 0951   BASOSABS 0.0 11/28/2021 0951    BMET    Component Value Date/Time   NA 138 11/28/2021 0951   K 4.2 11/28/2021 0951   CL 101 11/28/2021 0951   CO2 30 11/28/2021 0951   GLUCOSE 101 (H) 11/28/2021 0951   BUN 13 11/28/2021 0951   CREATININE 0.89 11/28/2021 0951   CALCIUM 9.4 11/28/2021 0951   GFRNONAA 94.83 03/06/2010 1004    BNP No results found for: "BNP"  ProBNP No results found for: "PROBNP"  Imaging: No results found.   Assessment & Plan:   Loud snoring - Patient has symptoms of loud snoring. Epworth score is 10.  BMI 26.  Suspicion patient could have underlying obstructive sleep apnea, needs home sleep study to evaluate.  We discussed risks of untreated sleep apnea including cardiac arrhythmias, pulmonary hypertension, diabetes and stroke.  We also reviewed treatment options including weight loss, oral appliance, CPAP therapy or referral to ENT for possible surgical options.  Encourage patient focus on side sleeping position or elevate head of bed 30 degrees.  Advised against driving experiencing excessive daytime sleepiness or fatigue.  Follow-up 1 to 2 weeks after sleep study to review results and treatment options if needed.  Deviated nasal septum - Seen by Dr. Benjamine Mola with ENT, needs repair   Asthma - Follows with Dr. Gwynneth Munson, NP 01/09/2022

## 2022-01-09 NOTE — Assessment & Plan Note (Addendum)
-   Seen by Dr. Benjamine Mola with ENT, needs repair

## 2022-01-10 DIAGNOSIS — J3089 Other allergic rhinitis: Secondary | ICD-10-CM | POA: Diagnosis not present

## 2022-01-10 DIAGNOSIS — J3081 Allergic rhinitis due to animal (cat) (dog) hair and dander: Secondary | ICD-10-CM | POA: Diagnosis not present

## 2022-01-26 ENCOUNTER — Other Ambulatory Visit: Payer: Self-pay | Admitting: Family Medicine

## 2022-01-29 DIAGNOSIS — Z8589 Personal history of malignant neoplasm of other organs and systems: Secondary | ICD-10-CM | POA: Diagnosis not present

## 2022-01-29 DIAGNOSIS — Z5189 Encounter for other specified aftercare: Secondary | ICD-10-CM | POA: Diagnosis not present

## 2022-01-29 DIAGNOSIS — D0430 Carcinoma in situ of skin of unspecified part of face: Secondary | ICD-10-CM | POA: Diagnosis not present

## 2022-01-30 ENCOUNTER — Telehealth: Payer: Self-pay | Admitting: Primary Care

## 2022-01-30 DIAGNOSIS — H524 Presbyopia: Secondary | ICD-10-CM | POA: Diagnosis not present

## 2022-01-30 DIAGNOSIS — D3132 Benign neoplasm of left choroid: Secondary | ICD-10-CM | POA: Diagnosis not present

## 2022-01-30 NOTE — Telephone Encounter (Signed)
PA has been documented on order notes & ready to schedule

## 2022-01-30 NOTE — Telephone Encounter (Signed)
Lmam for pt to call back and schedule for 10/31

## 2022-01-31 NOTE — Telephone Encounter (Signed)
Pt returned Raven's call about getting an appt scheduled for HST. Routing to [employer].

## 2022-02-04 ENCOUNTER — Ambulatory Visit: Payer: BC Managed Care – PPO

## 2022-02-04 DIAGNOSIS — R0683 Snoring: Secondary | ICD-10-CM

## 2022-02-07 DIAGNOSIS — R0683 Snoring: Secondary | ICD-10-CM | POA: Diagnosis not present

## 2022-02-11 ENCOUNTER — Other Ambulatory Visit: Payer: Self-pay | Admitting: Family Medicine

## 2022-02-12 ENCOUNTER — Telehealth: Payer: Self-pay | Admitting: Family Medicine

## 2022-02-12 MED ORDER — AZITHROMYCIN 250 MG PO TABS: ORAL_TABLET | ORAL | 0 refills | Status: DC

## 2022-02-12 NOTE — Telephone Encounter (Signed)
He has a sinus infection.  

## 2022-02-14 DIAGNOSIS — D225 Melanocytic nevi of trunk: Secondary | ICD-10-CM | POA: Diagnosis not present

## 2022-02-14 DIAGNOSIS — L578 Other skin changes due to chronic exposure to nonionizing radiation: Secondary | ICD-10-CM | POA: Diagnosis not present

## 2022-02-14 DIAGNOSIS — L821 Other seborrheic keratosis: Secondary | ICD-10-CM | POA: Diagnosis not present

## 2022-02-14 DIAGNOSIS — L570 Actinic keratosis: Secondary | ICD-10-CM | POA: Diagnosis not present

## 2022-02-21 ENCOUNTER — Encounter: Payer: Self-pay | Admitting: Pulmonary Disease

## 2022-03-12 DIAGNOSIS — J3081 Allergic rhinitis due to animal (cat) (dog) hair and dander: Secondary | ICD-10-CM | POA: Diagnosis not present

## 2022-03-12 DIAGNOSIS — J3089 Other allergic rhinitis: Secondary | ICD-10-CM | POA: Diagnosis not present

## 2022-03-12 DIAGNOSIS — J301 Allergic rhinitis due to pollen: Secondary | ICD-10-CM | POA: Diagnosis not present

## 2022-03-18 DIAGNOSIS — L570 Actinic keratosis: Secondary | ICD-10-CM | POA: Diagnosis not present

## 2022-04-18 DIAGNOSIS — J3081 Allergic rhinitis due to animal (cat) (dog) hair and dander: Secondary | ICD-10-CM | POA: Diagnosis not present

## 2022-04-18 DIAGNOSIS — J301 Allergic rhinitis due to pollen: Secondary | ICD-10-CM | POA: Diagnosis not present

## 2022-04-18 DIAGNOSIS — J3089 Other allergic rhinitis: Secondary | ICD-10-CM | POA: Diagnosis not present

## 2022-04-23 DIAGNOSIS — J3089 Other allergic rhinitis: Secondary | ICD-10-CM | POA: Diagnosis not present

## 2022-04-23 DIAGNOSIS — J301 Allergic rhinitis due to pollen: Secondary | ICD-10-CM | POA: Diagnosis not present

## 2022-04-23 DIAGNOSIS — J3081 Allergic rhinitis due to animal (cat) (dog) hair and dander: Secondary | ICD-10-CM | POA: Diagnosis not present

## 2022-05-01 DIAGNOSIS — J3081 Allergic rhinitis due to animal (cat) (dog) hair and dander: Secondary | ICD-10-CM | POA: Diagnosis not present

## 2022-05-01 DIAGNOSIS — J301 Allergic rhinitis due to pollen: Secondary | ICD-10-CM | POA: Diagnosis not present

## 2022-05-01 DIAGNOSIS — J3089 Other allergic rhinitis: Secondary | ICD-10-CM | POA: Diagnosis not present

## 2022-05-07 DIAGNOSIS — J3081 Allergic rhinitis due to animal (cat) (dog) hair and dander: Secondary | ICD-10-CM | POA: Diagnosis not present

## 2022-05-07 DIAGNOSIS — J3089 Other allergic rhinitis: Secondary | ICD-10-CM | POA: Diagnosis not present

## 2022-05-07 DIAGNOSIS — J301 Allergic rhinitis due to pollen: Secondary | ICD-10-CM | POA: Diagnosis not present

## 2022-05-13 ENCOUNTER — Other Ambulatory Visit: Payer: Self-pay | Admitting: Family Medicine

## 2022-05-13 DIAGNOSIS — J3089 Other allergic rhinitis: Secondary | ICD-10-CM | POA: Diagnosis not present

## 2022-05-13 DIAGNOSIS — J3081 Allergic rhinitis due to animal (cat) (dog) hair and dander: Secondary | ICD-10-CM | POA: Diagnosis not present

## 2022-05-13 DIAGNOSIS — J301 Allergic rhinitis due to pollen: Secondary | ICD-10-CM | POA: Diagnosis not present

## 2022-05-20 DIAGNOSIS — J3081 Allergic rhinitis due to animal (cat) (dog) hair and dander: Secondary | ICD-10-CM | POA: Diagnosis not present

## 2022-05-20 DIAGNOSIS — J301 Allergic rhinitis due to pollen: Secondary | ICD-10-CM | POA: Diagnosis not present

## 2022-05-20 DIAGNOSIS — J3089 Other allergic rhinitis: Secondary | ICD-10-CM | POA: Diagnosis not present

## 2022-05-27 DIAGNOSIS — J3081 Allergic rhinitis due to animal (cat) (dog) hair and dander: Secondary | ICD-10-CM | POA: Diagnosis not present

## 2022-05-27 DIAGNOSIS — J3089 Other allergic rhinitis: Secondary | ICD-10-CM | POA: Diagnosis not present

## 2022-05-27 DIAGNOSIS — J301 Allergic rhinitis due to pollen: Secondary | ICD-10-CM | POA: Diagnosis not present

## 2022-06-03 DIAGNOSIS — J3081 Allergic rhinitis due to animal (cat) (dog) hair and dander: Secondary | ICD-10-CM | POA: Diagnosis not present

## 2022-06-03 DIAGNOSIS — J301 Allergic rhinitis due to pollen: Secondary | ICD-10-CM | POA: Diagnosis not present

## 2022-06-03 DIAGNOSIS — J3089 Other allergic rhinitis: Secondary | ICD-10-CM | POA: Diagnosis not present

## 2022-06-16 DIAGNOSIS — J301 Allergic rhinitis due to pollen: Secondary | ICD-10-CM | POA: Diagnosis not present

## 2022-06-16 DIAGNOSIS — J3089 Other allergic rhinitis: Secondary | ICD-10-CM | POA: Diagnosis not present

## 2022-06-16 DIAGNOSIS — J3081 Allergic rhinitis due to animal (cat) (dog) hair and dander: Secondary | ICD-10-CM | POA: Diagnosis not present

## 2022-06-17 DIAGNOSIS — L570 Actinic keratosis: Secondary | ICD-10-CM | POA: Diagnosis not present

## 2022-06-17 DIAGNOSIS — L738 Other specified follicular disorders: Secondary | ICD-10-CM | POA: Diagnosis not present

## 2022-06-25 DIAGNOSIS — J3089 Other allergic rhinitis: Secondary | ICD-10-CM | POA: Diagnosis not present

## 2022-06-25 DIAGNOSIS — J301 Allergic rhinitis due to pollen: Secondary | ICD-10-CM | POA: Diagnosis not present

## 2022-06-25 DIAGNOSIS — H1045 Other chronic allergic conjunctivitis: Secondary | ICD-10-CM | POA: Diagnosis not present

## 2022-06-25 DIAGNOSIS — J3081 Allergic rhinitis due to animal (cat) (dog) hair and dander: Secondary | ICD-10-CM | POA: Diagnosis not present

## 2022-07-08 DIAGNOSIS — J301 Allergic rhinitis due to pollen: Secondary | ICD-10-CM | POA: Diagnosis not present

## 2022-07-08 DIAGNOSIS — J3081 Allergic rhinitis due to animal (cat) (dog) hair and dander: Secondary | ICD-10-CM | POA: Diagnosis not present

## 2022-07-08 DIAGNOSIS — J3089 Other allergic rhinitis: Secondary | ICD-10-CM | POA: Diagnosis not present

## 2022-07-15 ENCOUNTER — Telehealth: Payer: Self-pay | Admitting: Family Medicine

## 2022-07-15 MED ORDER — AZITHROMYCIN 250 MG PO TABS: ORAL_TABLET | ORAL | 0 refills | Status: DC

## 2022-07-15 NOTE — Telephone Encounter (Signed)
For a sinus infection  

## 2022-07-23 DIAGNOSIS — J3081 Allergic rhinitis due to animal (cat) (dog) hair and dander: Secondary | ICD-10-CM | POA: Diagnosis not present

## 2022-07-23 DIAGNOSIS — J3089 Other allergic rhinitis: Secondary | ICD-10-CM | POA: Diagnosis not present

## 2022-07-23 DIAGNOSIS — J301 Allergic rhinitis due to pollen: Secondary | ICD-10-CM | POA: Diagnosis not present

## 2022-07-31 DIAGNOSIS — J3081 Allergic rhinitis due to animal (cat) (dog) hair and dander: Secondary | ICD-10-CM | POA: Diagnosis not present

## 2022-07-31 DIAGNOSIS — J301 Allergic rhinitis due to pollen: Secondary | ICD-10-CM | POA: Diagnosis not present

## 2022-07-31 DIAGNOSIS — J3089 Other allergic rhinitis: Secondary | ICD-10-CM | POA: Diagnosis not present

## 2022-08-08 DIAGNOSIS — J3081 Allergic rhinitis due to animal (cat) (dog) hair and dander: Secondary | ICD-10-CM | POA: Diagnosis not present

## 2022-08-08 DIAGNOSIS — J301 Allergic rhinitis due to pollen: Secondary | ICD-10-CM | POA: Diagnosis not present

## 2022-08-08 DIAGNOSIS — J3089 Other allergic rhinitis: Secondary | ICD-10-CM | POA: Diagnosis not present

## 2022-08-09 ENCOUNTER — Other Ambulatory Visit: Payer: Self-pay | Admitting: Family Medicine

## 2022-08-19 DIAGNOSIS — J301 Allergic rhinitis due to pollen: Secondary | ICD-10-CM | POA: Diagnosis not present

## 2022-08-19 DIAGNOSIS — J3089 Other allergic rhinitis: Secondary | ICD-10-CM | POA: Diagnosis not present

## 2022-08-19 DIAGNOSIS — J3081 Allergic rhinitis due to animal (cat) (dog) hair and dander: Secondary | ICD-10-CM | POA: Diagnosis not present

## 2022-08-29 DIAGNOSIS — J301 Allergic rhinitis due to pollen: Secondary | ICD-10-CM | POA: Diagnosis not present

## 2022-08-29 DIAGNOSIS — J3081 Allergic rhinitis due to animal (cat) (dog) hair and dander: Secondary | ICD-10-CM | POA: Diagnosis not present

## 2022-08-29 DIAGNOSIS — J3089 Other allergic rhinitis: Secondary | ICD-10-CM | POA: Diagnosis not present

## 2022-09-05 DIAGNOSIS — J301 Allergic rhinitis due to pollen: Secondary | ICD-10-CM | POA: Diagnosis not present

## 2022-09-05 DIAGNOSIS — J3089 Other allergic rhinitis: Secondary | ICD-10-CM | POA: Diagnosis not present

## 2022-09-05 DIAGNOSIS — J3081 Allergic rhinitis due to animal (cat) (dog) hair and dander: Secondary | ICD-10-CM | POA: Diagnosis not present

## 2022-09-11 DIAGNOSIS — J301 Allergic rhinitis due to pollen: Secondary | ICD-10-CM | POA: Diagnosis not present

## 2022-09-11 DIAGNOSIS — J3081 Allergic rhinitis due to animal (cat) (dog) hair and dander: Secondary | ICD-10-CM | POA: Diagnosis not present

## 2022-09-11 DIAGNOSIS — J3089 Other allergic rhinitis: Secondary | ICD-10-CM | POA: Diagnosis not present

## 2022-09-23 DIAGNOSIS — J3089 Other allergic rhinitis: Secondary | ICD-10-CM | POA: Diagnosis not present

## 2022-09-23 DIAGNOSIS — J301 Allergic rhinitis due to pollen: Secondary | ICD-10-CM | POA: Diagnosis not present

## 2022-09-23 DIAGNOSIS — J3081 Allergic rhinitis due to animal (cat) (dog) hair and dander: Secondary | ICD-10-CM | POA: Diagnosis not present

## 2022-09-30 DIAGNOSIS — J301 Allergic rhinitis due to pollen: Secondary | ICD-10-CM | POA: Diagnosis not present

## 2022-09-30 DIAGNOSIS — J3089 Other allergic rhinitis: Secondary | ICD-10-CM | POA: Diagnosis not present

## 2022-09-30 DIAGNOSIS — J3081 Allergic rhinitis due to animal (cat) (dog) hair and dander: Secondary | ICD-10-CM | POA: Diagnosis not present

## 2022-10-07 DIAGNOSIS — J3089 Other allergic rhinitis: Secondary | ICD-10-CM | POA: Diagnosis not present

## 2022-10-07 DIAGNOSIS — J301 Allergic rhinitis due to pollen: Secondary | ICD-10-CM | POA: Diagnosis not present

## 2022-10-07 DIAGNOSIS — J3081 Allergic rhinitis due to animal (cat) (dog) hair and dander: Secondary | ICD-10-CM | POA: Diagnosis not present

## 2022-10-21 DIAGNOSIS — J3089 Other allergic rhinitis: Secondary | ICD-10-CM | POA: Diagnosis not present

## 2022-10-21 DIAGNOSIS — J3081 Allergic rhinitis due to animal (cat) (dog) hair and dander: Secondary | ICD-10-CM | POA: Diagnosis not present

## 2022-10-21 DIAGNOSIS — J301 Allergic rhinitis due to pollen: Secondary | ICD-10-CM | POA: Diagnosis not present

## 2022-10-23 DIAGNOSIS — J301 Allergic rhinitis due to pollen: Secondary | ICD-10-CM | POA: Diagnosis not present

## 2022-10-24 DIAGNOSIS — J3089 Other allergic rhinitis: Secondary | ICD-10-CM | POA: Diagnosis not present

## 2022-10-24 DIAGNOSIS — J3081 Allergic rhinitis due to animal (cat) (dog) hair and dander: Secondary | ICD-10-CM | POA: Diagnosis not present

## 2022-10-30 DIAGNOSIS — J3089 Other allergic rhinitis: Secondary | ICD-10-CM | POA: Diagnosis not present

## 2022-10-30 DIAGNOSIS — J301 Allergic rhinitis due to pollen: Secondary | ICD-10-CM | POA: Diagnosis not present

## 2022-10-30 DIAGNOSIS — J3081 Allergic rhinitis due to animal (cat) (dog) hair and dander: Secondary | ICD-10-CM | POA: Diagnosis not present

## 2022-11-08 ENCOUNTER — Other Ambulatory Visit: Payer: Self-pay | Admitting: Family Medicine

## 2022-11-10 NOTE — Telephone Encounter (Signed)
Cpe scheduled for 12/02/22

## 2022-11-11 DIAGNOSIS — J3081 Allergic rhinitis due to animal (cat) (dog) hair and dander: Secondary | ICD-10-CM | POA: Diagnosis not present

## 2022-11-11 DIAGNOSIS — J3089 Other allergic rhinitis: Secondary | ICD-10-CM | POA: Diagnosis not present

## 2022-11-11 DIAGNOSIS — J301 Allergic rhinitis due to pollen: Secondary | ICD-10-CM | POA: Diagnosis not present

## 2022-11-21 DIAGNOSIS — J301 Allergic rhinitis due to pollen: Secondary | ICD-10-CM | POA: Diagnosis not present

## 2022-11-21 DIAGNOSIS — J3089 Other allergic rhinitis: Secondary | ICD-10-CM | POA: Diagnosis not present

## 2022-11-21 DIAGNOSIS — J3081 Allergic rhinitis due to animal (cat) (dog) hair and dander: Secondary | ICD-10-CM | POA: Diagnosis not present

## 2022-11-27 DIAGNOSIS — J3089 Other allergic rhinitis: Secondary | ICD-10-CM | POA: Diagnosis not present

## 2022-11-27 DIAGNOSIS — J301 Allergic rhinitis due to pollen: Secondary | ICD-10-CM | POA: Diagnosis not present

## 2022-11-27 DIAGNOSIS — J3081 Allergic rhinitis due to animal (cat) (dog) hair and dander: Secondary | ICD-10-CM | POA: Diagnosis not present

## 2022-12-02 ENCOUNTER — Ambulatory Visit (INDEPENDENT_AMBULATORY_CARE_PROVIDER_SITE_OTHER): Payer: BC Managed Care – PPO | Admitting: Family Medicine

## 2022-12-02 ENCOUNTER — Encounter: Payer: Self-pay | Admitting: Family Medicine

## 2022-12-02 ENCOUNTER — Other Ambulatory Visit: Payer: Self-pay

## 2022-12-02 VITALS — BP 128/82 | HR 71 | Temp 98.0°F | Ht 68.25 in | Wt 179.2 lb

## 2022-12-02 DIAGNOSIS — Z0000 Encounter for general adult medical examination without abnormal findings: Secondary | ICD-10-CM

## 2022-12-02 DIAGNOSIS — Z136 Encounter for screening for cardiovascular disorders: Secondary | ICD-10-CM | POA: Diagnosis not present

## 2022-12-02 DIAGNOSIS — B9689 Other specified bacterial agents as the cause of diseases classified elsewhere: Secondary | ICD-10-CM

## 2022-12-02 DIAGNOSIS — J019 Acute sinusitis, unspecified: Secondary | ICD-10-CM

## 2022-12-02 LAB — BASIC METABOLIC PANEL
BUN: 14 mg/dL (ref 6–23)
CO2: 31 mEq/L (ref 19–32)
Calcium: 9.5 mg/dL (ref 8.4–10.5)
Chloride: 101 mEq/L (ref 96–112)
Creatinine, Ser: 0.93 mg/dL (ref 0.40–1.50)
GFR: 91.41 mL/min (ref >60.00)
Glucose, Bld: 101 mg/dL — ABNORMAL HIGH (ref 70–99)
Potassium: 4.3 mEq/L (ref 3.5–5.1)
Sodium: 141 mEq/L (ref 135–145)

## 2022-12-02 LAB — CBC WITH DIFFERENTIAL/PLATELET
Basophils Absolute: 0 10*3/uL (ref 0.0–0.1)
Basophils Relative: 0.3 % (ref 0.0–3.0)
Eosinophils Absolute: 0.1 10*3/uL (ref 0.0–0.7)
Eosinophils Relative: 3.2 % (ref 0.0–5.0)
HCT: 45.8 % (ref 39.0–52.0)
Hemoglobin: 15.2 g/dL (ref 13.0–17.0)
Lymphocytes Relative: 31.3 % (ref 12.0–46.0)
Lymphs Abs: 1.4 10*3/uL (ref 0.7–4.0)
MCHC: 33.2 g/dL (ref 30.0–36.0)
MCV: 90.9 fl (ref 78.0–100.0)
Monocytes Absolute: 0.4 10*3/uL (ref 0.1–1.0)
Monocytes Relative: 8.9 % (ref 3.0–12.0)
Neutro Abs: 2.6 10*3/uL (ref 1.4–7.7)
Neutrophils Relative %: 56.3 % (ref 43.0–77.0)
Platelets: 234 10*3/uL (ref 150.0–400.0)
RBC: 5.04 Mil/uL (ref 4.22–5.81)
RDW: 12.4 % (ref 11.5–15.5)
WBC: 4.6 10*3/uL (ref 4.0–10.5)

## 2022-12-02 LAB — HEPATIC FUNCTION PANEL
ALT: 22 U/L (ref 0–53)
AST: 23 U/L (ref 0–37)
Albumin: 4.4 g/dL (ref 3.5–5.2)
Alkaline Phosphatase: 83 U/L (ref 39–117)
Bilirubin, Direct: 0.3 mg/dL (ref 0.0–0.3)
Total Bilirubin: 1.9 mg/dL — ABNORMAL HIGH (ref 0.2–1.2)
Total Protein: 7.1 g/dL (ref 6.0–8.3)

## 2022-12-02 LAB — LIPID PANEL
Cholesterol: 179 mg/dL (ref 0–200)
HDL: 57.1 mg/dL (ref >39.00)
LDL Cholesterol: 91 mg/dL (ref 0–99)
NonHDL: 121.53
Total CHOL/HDL Ratio: 3
Triglycerides: 155 mg/dL — ABNORMAL HIGH (ref 0.0–149.0)
VLDL: 31 mg/dL (ref 0.0–40.0)

## 2022-12-02 LAB — PSA: PSA: 2.22 ng/mL (ref 0.10–4.00)

## 2022-12-02 LAB — HEMOGLOBIN A1C: Hgb A1c MFr Bld: 5.5 % (ref 4.6–6.5)

## 2022-12-02 LAB — TSH: TSH: 2.23 u[IU]/mL (ref 0.35–5.50)

## 2022-12-02 MED ORDER — ZOLPIDEM TARTRATE 10 MG PO TABS: 10.0000 mg | ORAL_TABLET | Freq: Every evening | ORAL | 0 refills | Status: DC | PRN

## 2022-12-02 MED ORDER — FLUTICASONE PROPIONATE 50 MCG/ACT NA SUSP
2.0000 | Freq: Every day | NASAL | 11 refills | Status: AC
Start: 2022-12-02 — End: ?

## 2022-12-02 NOTE — Progress Notes (Signed)
Subjective:    Patient ID: Brent Bennett, male    DOB: 12-31-1965, 57 y.o.   MRN: [id number]  HPI Here for a well exam. He is doing well.    Review of Systems  Constitutional: Negative.   HENT: Negative.    Eyes: Negative.   Respiratory: Negative.    Cardiovascular: Negative.   Gastrointestinal: Negative.   Genitourinary: Negative.   Musculoskeletal: Negative.   Skin: Negative.   Neurological: Negative.   Psychiatric/Behavioral: Negative.         Objective:   Physical Exam Constitutional:      General: He is not in acute distress.    Appearance: Normal appearance. He is well-developed. He is not diaphoretic.  HENT:     Head: Normocephalic and atraumatic.     Right Ear: External ear normal.     Left Ear: External ear normal.     Nose: Nose normal.     Mouth/Throat:     Pharynx: No oropharyngeal exudate.  Eyes:     General: No scleral icterus.       Right eye: No discharge.        Left eye: No discharge.     Conjunctiva/sclera: Conjunctivae normal.     Pupils: Pupils are equal, round, and reactive to light.  Neck:     Thyroid: No thyromegaly.     Vascular: No JVD.     Trachea: No tracheal deviation.  Cardiovascular:     Rate and Rhythm: Normal rate and regular rhythm.     Pulses: Normal pulses.     Heart sounds: Normal heart sounds. No murmur heard.    No friction rub. No gallop.  Pulmonary:     Effort: Pulmonary effort is normal. No respiratory distress.     Breath sounds: Normal breath sounds. No wheezing or rales.  Chest:     Chest wall: No tenderness.  Abdominal:     General: Bowel sounds are normal. There is no distension.     Palpations: Abdomen is soft. There is no mass.     Tenderness: There is no abdominal tenderness. There is no guarding or rebound.  Genitourinary:    Penis: Normal. No tenderness.      Testes: Normal.     Prostate: Normal.     Rectum: Normal. Guaiac result negative.  Musculoskeletal:        General: No tenderness. Normal  range of motion.     Cervical back: Neck supple.  Lymphadenopathy:     Cervical: No cervical adenopathy.  Skin:    General: Skin is warm and dry.     Coloration: Skin is not pale.     Findings: No erythema or rash.  Neurological:     General: No focal deficit present.     Mental Status: He is alert and oriented to person, place, and time.     Cranial Nerves: No cranial nerve deficit.     Motor: No abnormal muscle tone.     Coordination: Coordination normal.     Deep Tendon Reflexes: Reflexes are normal and symmetric. Reflexes normal.  Psychiatric:        Mood and Affect: Mood normal.        Behavior: Behavior normal.        Thought Content: Thought content normal.        Judgment: Judgment normal.           Assessment & Plan:  Well exam. We discussed diet and exercise. Get fasting labs. Jeannett Senior  Clent Ridges, MD

## 2022-12-11 DIAGNOSIS — J301 Allergic rhinitis due to pollen: Secondary | ICD-10-CM | POA: Diagnosis not present

## 2022-12-11 DIAGNOSIS — J3081 Allergic rhinitis due to animal (cat) (dog) hair and dander: Secondary | ICD-10-CM | POA: Diagnosis not present

## 2022-12-11 DIAGNOSIS — J3089 Other allergic rhinitis: Secondary | ICD-10-CM | POA: Diagnosis not present

## 2022-12-15 DIAGNOSIS — R931 Abnormal findings on diagnostic imaging of heart and coronary circulation: Secondary | ICD-10-CM

## 2022-12-15 DIAGNOSIS — Z8249 Family history of ischemic heart disease and other diseases of the circulatory system: Secondary | ICD-10-CM

## 2022-12-17 ENCOUNTER — Ambulatory Visit (HOSPITAL_BASED_OUTPATIENT_CLINIC_OR_DEPARTMENT_OTHER)
Admission: RE | Admit: 2022-12-17 | Discharge: 2022-12-17 | Disposition: A | Discharge status: Home / Self Care | Payer: BC Managed Care – PPO | Source: Ambulatory Visit | Attending: Family Medicine | Admitting: Family Medicine

## 2022-12-17 DIAGNOSIS — Z136 Encounter for screening for cardiovascular disorders: Secondary | ICD-10-CM | POA: Insufficient documentation

## 2022-12-19 NOTE — Telephone Encounter (Signed)
We will refer him to Cardiology for an elevated cardiac calcium score and family hx of heart disease

## 2022-12-23 DIAGNOSIS — J3089 Other allergic rhinitis: Secondary | ICD-10-CM | POA: Diagnosis not present

## 2022-12-23 DIAGNOSIS — J301 Allergic rhinitis due to pollen: Secondary | ICD-10-CM | POA: Diagnosis not present

## 2022-12-23 DIAGNOSIS — J3081 Allergic rhinitis due to animal (cat) (dog) hair and dander: Secondary | ICD-10-CM | POA: Diagnosis not present

## 2022-12-31 ENCOUNTER — Ambulatory Visit (INDEPENDENT_AMBULATORY_CARE_PROVIDER_SITE_OTHER): Payer: BC Managed Care – PPO | Admitting: Nurse Practitioner

## 2022-12-31 ENCOUNTER — Encounter (HOSPITAL_BASED_OUTPATIENT_CLINIC_OR_DEPARTMENT_OTHER): Payer: Self-pay | Admitting: Nurse Practitioner

## 2022-12-31 VITALS — BP 122/86 | HR 71 | Ht 69.0 in | Wt 180.7 lb

## 2022-12-31 DIAGNOSIS — I2510 Atherosclerotic heart disease of native coronary artery without angina pectoris: Secondary | ICD-10-CM | POA: Diagnosis not present

## 2022-12-31 DIAGNOSIS — R931 Abnormal findings on diagnostic imaging of heart and coronary circulation: Secondary | ICD-10-CM

## 2022-12-31 DIAGNOSIS — I77810 Thoracic aortic ectasia: Secondary | ICD-10-CM

## 2022-12-31 DIAGNOSIS — D225 Melanocytic nevi of trunk: Secondary | ICD-10-CM | POA: Diagnosis not present

## 2022-12-31 DIAGNOSIS — L578 Other skin changes due to chronic exposure to nonionizing radiation: Secondary | ICD-10-CM | POA: Diagnosis not present

## 2022-12-31 DIAGNOSIS — I700 Atherosclerosis of aorta: Secondary | ICD-10-CM

## 2022-12-31 DIAGNOSIS — Z7189 Other specified counseling: Secondary | ICD-10-CM

## 2022-12-31 DIAGNOSIS — D2372 Other benign neoplasm of skin of left lower limb, including hip: Secondary | ICD-10-CM | POA: Diagnosis not present

## 2022-12-31 DIAGNOSIS — E785 Hyperlipidemia, unspecified: Secondary | ICD-10-CM

## 2022-12-31 DIAGNOSIS — L905 Scar conditions and fibrosis of skin: Secondary | ICD-10-CM | POA: Diagnosis not present

## 2022-12-31 DIAGNOSIS — L570 Actinic keratosis: Secondary | ICD-10-CM | POA: Diagnosis not present

## 2022-12-31 DIAGNOSIS — L821 Other seborrheic keratosis: Secondary | ICD-10-CM | POA: Diagnosis not present

## 2022-12-31 DIAGNOSIS — D485 Neoplasm of uncertain behavior of skin: Secondary | ICD-10-CM | POA: Diagnosis not present

## 2022-12-31 MED ORDER — ATORVASTATIN CALCIUM 20 MG PO TABS: 20.0000 mg | ORAL_TABLET | Freq: Every day | ORAL | 3 refills | Status: DC

## 2022-12-31 NOTE — Progress Notes (Signed)
Cardiology Office Note:  .   Date:  12/31/2022 ID:  Brent Bennett, DOB 11-17-1965, MRN [id number] PCP: Nelwyn Salisbury, MD [facility] Providers Cardiologist:  None   Patient Profile: .      PMH Coronary artery disease CT calcium score 12/17/22 with CAC 257 (88th percentile) Aortic atherosclerosis Dilated ascending aorta Mildly dilated ascending aorta 39 mm on CT 12/17/22 Hyperlipidemia Family history High cholesterol in both parents and siblings Maternal aunts and uncles with prior heart attack        History of Present Illness: Marland Kitchen   Brent Bennett is a very pleasant 57 y.o. male  who is here today for new patient consult for elevated coronary artery calcium score on CT 12/17/2022.  CT reveals CAC of 257 which is 88th percentile for age/race matched controls.  It also shows he has a mildly dilated aortic root and mild aortic atherosclerosis. He reports he is feeling well. We discussed his reason for undergoing CT calcium scoring.  He has been on statin for approximately 2 years.  He is overall very active including running or cycling, and playing soccer several days per week along with some weightlifting. No concerning cardiac symptoms with exercise. He denies chest pain, palpitations, shortness of breath, orthopnea, edema, PND, presyncope, syncope.  Admits diet is not as healthy as it should be. Has young children so they tend to eat more carbohydrates with the kids.  Had a sleep test because he snores and admits to poor sleep. Sleep test showed obstruction likely from deviated septum.  He takes melatonin to help.  Family history: His family history includes Liver disease in his brother and father; Lung cancer in his brother and father.   ASCVD Risk Score: The 10-year ASCVD risk score (Arnett DK, et al., 2019) is: 4.9%   Values used to calculate the score:     Age: 69 years     Sex: Male     Is Non-Hispanic African American: No     Diabetic: No     Tobacco smoker: No      Systolic Blood Pressure: 122 mmHg     Is BP treated: No     HDL Cholesterol: 57.1 mg/dL     Total Cholesterol: 179 mg/dL    Diet: Breakfast: yogurt and fruit Lunch: salads Dinner: chicken, steak, shrimp, admits to pasta Frequent eggs, burgers   Activity: Cycles or runs 5 days per week 1 to 2 classes per week Soccer 1 or 2 days per week  ROS: See HPI       Studies Reviewed: Marland Kitchen   EKG Interpretation Date/Time:  Wednesday December 31 2022 14:55:22 EDT Ventricular Rate:  71 PR Interval:  186 QRS Duration:  80 QT Interval:  372 QTC Calculation: 404 R Axis:   11  Text Interpretation: Normal sinus rhythm Nonspecific T wave abnormality No previous ECGs available Confirmed by Eligha Bridegroom [phone]) on 12/31/2022 3:01:53 PM       Risk Assessment/Calculations:             Physical Exam:   VS: BP 122/86   Pulse 71   Ht 5\' 9"  (1.753 m)   Wt 180 lb 11.2 oz (82 kg)   SpO2 98%   BMI 26.68 kg/m   Wt Readings from Last 3 Encounters:  12/31/22 180 lb 11.2 oz (82 kg)  12/02/22 179 lb 3.2 oz (81.3 kg)  01/09/22 178 lb 6.4 oz (80.9 kg)     GEN: Well nourished, well developed  in no acute distress NECK: No JVD; No carotid bruits CARDIAC: RRR, no murmurs, rubs, gallops RESPIRATORY:  Clear to auscultation without rales, wheezing or rhonchi  ABDOMEN: Soft, non-tender, non-distended EXTREMITIES:  No edema; No deformity     ASSESSMENT AND PLAN: .    CAD: CT calcium score completed 12/17/2022 revealed CAC score of 257, 88 percentile for age/sex matched controls.  There is no calcium in the left main or LCx.  He has nonspecific T wave abnormality on EKG today but no other EKG for comparison. He is asymptomatic. He works out on a consistent basis including regular cardiovascular exercise with no concerning cardiac symptoms. He has been on atorvastatin for several years at a low dose. Lengthy discussion about ASCVD risk and future testing.  I have asked him to notify us if he develops  concerning symptoms with exercise.  Working on goal of LDL less than 70. Encouraged secondary prevention with heart healthy diet avoiding processed foods, saturated fat, simple carbohydrates, and sugar.   Hyperlipidemia LDL goal < 70/Aortic atherosclerosis: LDL 91, TGs 155, HDL 57 on  12/02/22. We will increase atorvastatin to 20 mg daily and recheck lipid panel along with Apo B and Lp(a) in 2-3 months.  Emphasized the importance of heart healthy diet along with regular physical activity.  Thoracic aortic dilatation: CT 12/28/22 revealed mildly dilated ascending aorta at 39 mm. We will plan for repeat imaging in 1 year. Aneurysm precautions reviewed.  CV Risk Assessment: ASCVD risk score 4.9%. We discussed the components of elevated risk. He has been treating his hyperlipidemia for several years and maintains an active lifestyle. No history of smoking, diabetes, or hypertension. Family history is not significant for early CAD. We will increase intensity of statin therapy and recheck lipid panel along with apolipoprotein B and lipoprotein a in 2 to 3 months.  Plan/Goals: Work on dietary improvements to incorporate more plant based options in the place of simple carbohydrates like zucchini noodles or whole wheat pasta in the place of white pasta.   Activity:  Continue to aim for at least 150 minutes moderate intensity exercise each week.    Dispo: 3 months with me  Signed, Eligha Bridegroom, NP-C

## 2022-12-31 NOTE — Patient Instructions (Signed)
Medication Instructions:   INCREASE Atorvastatin one ( 1) tablet by mouth ( 20 mg) daily.   *If you need a refill on your cardiac medications before your next appointment, please call your pharmacy*   Lab Work:  Your physician recommends that you return for a FASTING NMR/LPA/apolipoprotein b at the end of November. I will send you a reminder in mychart.   If you have labs (blood work) drawn today and your tests are completely normal, you will receive your results only by: MyChart Message (if you have MyChart) OR A paper copy in the mail If you have any lab test that is abnormal or we need to change your treatment, we will call you to review the results.   Testing/Procedures:  None ordered.   Follow-Up: At [facility], you and your health needs are our priority.  As part of our continuing mission to provide you with exceptional heart care, we have created designated Provider Care Teams.  These Care Teams include your primary Cardiologist (physician) and Advanced Practice Providers (APPs -  Physician Assistants and Nurse Practitioners) who all work together to provide you with the care you need, when you need it.  We recommend signing up for the patient portal called "MyChart".  Sign up information is provided on this After Visit Summary.  MyChart is used to connect with patients for Virtual Visits (Telemedicine).  Patients are able to view lab/test results, encounter notes, upcoming appointments, etc.  Non-urgent messages can be sent to your provider as well.   To learn more about what you can do with MyChart, go to [email or web address].    Your next appointment:   3 month(s)  Provider:   Eligha Bridegroom, NP    Other Instructions   One of your tests has shown an aneurysm of your aorta. The word "aneurysm" refers to a bulge in an artery (blood vessel). Most people think of them in the context of an emergency, but yours was found incidentally. At this point there is  nothing you need to do from a procedure standpoint, but there are some important things to keep in mind for day-to-day life.  Mainstays of therapy for aneurysms include very good blood pressure control, healthy lifestyle, and avoiding tobacco products and street drugs. Research has raised concern that antibiotics in the fluoroquinolone class could be associated with increased risk of having an aneurysm develop or tear. This includes medicines that end in "floxacin," like Cipro or Levaquin. Make sure to discuss this information with other healthcare providers if you require antibiotics.  Since aneurysms can run in families, you should discuss your diagnosis with first degree relatives as they may need to be screened for this. Regular mild-moderate physical exercise is important, but avoid heavy lifting/weight lifting over 30lbs, chopping wood, shoveling snow or digging heavy earth with a shovel. It is best to avoid activities that cause grunting or straining (medically referred to as a "Valsalva maneuver"). This happens when a person bears down against a closed throat to increase the strength of arm or abdominal muscles. There's often a tendency to do this when lifting heavy weights, doing sit-ups, push-ups or chin-ups, etc., but it may be harmful.  This is a finding I would expect to be monitored periodically by your cardiology team. Most unruptured thoracic aortic aneurysms cause no symptoms, so they are often found during exams for other conditions. Contact a health care provider if you develop any discomfort in your upper back, neck, abdomen, trouble swallowing, cough or hoarseness,  or unexplained weight loss. Get help right away if you develop severe pain in your upper back or abdomen that may move into your chest and arms, or any other concerning symptoms such as shortness of breath or fever.

## 2023-01-05 DIAGNOSIS — J301 Allergic rhinitis due to pollen: Secondary | ICD-10-CM | POA: Diagnosis not present

## 2023-01-05 DIAGNOSIS — J3081 Allergic rhinitis due to animal (cat) (dog) hair and dander: Secondary | ICD-10-CM | POA: Diagnosis not present

## 2023-01-05 DIAGNOSIS — J3089 Other allergic rhinitis: Secondary | ICD-10-CM | POA: Diagnosis not present

## 2023-01-15 DIAGNOSIS — J301 Allergic rhinitis due to pollen: Secondary | ICD-10-CM | POA: Diagnosis not present

## 2023-01-15 DIAGNOSIS — J3089 Other allergic rhinitis: Secondary | ICD-10-CM | POA: Diagnosis not present

## 2023-01-15 DIAGNOSIS — J3081 Allergic rhinitis due to animal (cat) (dog) hair and dander: Secondary | ICD-10-CM | POA: Diagnosis not present

## 2023-01-30 DIAGNOSIS — J3089 Other allergic rhinitis: Secondary | ICD-10-CM | POA: Diagnosis not present

## 2023-01-30 DIAGNOSIS — J3081 Allergic rhinitis due to animal (cat) (dog) hair and dander: Secondary | ICD-10-CM | POA: Diagnosis not present

## 2023-01-30 DIAGNOSIS — J301 Allergic rhinitis due to pollen: Secondary | ICD-10-CM | POA: Diagnosis not present

## 2023-02-17 DIAGNOSIS — J3081 Allergic rhinitis due to animal (cat) (dog) hair and dander: Secondary | ICD-10-CM | POA: Diagnosis not present

## 2023-02-17 DIAGNOSIS — J3089 Other allergic rhinitis: Secondary | ICD-10-CM | POA: Diagnosis not present

## 2023-02-17 DIAGNOSIS — J301 Allergic rhinitis due to pollen: Secondary | ICD-10-CM | POA: Diagnosis not present

## 2023-02-20 DIAGNOSIS — L570 Actinic keratosis: Secondary | ICD-10-CM | POA: Diagnosis not present

## 2023-02-20 DIAGNOSIS — Z808 Family history of malignant neoplasm of other organs or systems: Secondary | ICD-10-CM | POA: Diagnosis not present

## 2023-02-20 DIAGNOSIS — D2271 Melanocytic nevi of right lower limb, including hip: Secondary | ICD-10-CM | POA: Diagnosis not present

## 2023-02-20 DIAGNOSIS — D224 Melanocytic nevi of scalp and neck: Secondary | ICD-10-CM | POA: Diagnosis not present

## 2023-02-26 DIAGNOSIS — L570 Actinic keratosis: Secondary | ICD-10-CM | POA: Diagnosis not present

## 2023-03-03 DIAGNOSIS — J3081 Allergic rhinitis due to animal (cat) (dog) hair and dander: Secondary | ICD-10-CM | POA: Diagnosis not present

## 2023-03-03 DIAGNOSIS — J3089 Other allergic rhinitis: Secondary | ICD-10-CM | POA: Diagnosis not present

## 2023-03-03 DIAGNOSIS — J301 Allergic rhinitis due to pollen: Secondary | ICD-10-CM | POA: Diagnosis not present

## 2023-03-16 ENCOUNTER — Other Ambulatory Visit: Payer: Self-pay | Admitting: Family Medicine

## 2023-03-18 ENCOUNTER — Ambulatory Visit (HOSPITAL_BASED_OUTPATIENT_CLINIC_OR_DEPARTMENT_OTHER): Payer: BC Managed Care – PPO | Admitting: Nurse Practitioner

## 2023-03-18 NOTE — Telephone Encounter (Signed)
LOV 12/02/22 Please advise

## 2023-04-14 DIAGNOSIS — M2141 Flat foot [pes planus] (acquired), right foot: Secondary | ICD-10-CM | POA: Diagnosis not present

## 2023-04-14 DIAGNOSIS — M2142 Flat foot [pes planus] (acquired), left foot: Secondary | ICD-10-CM | POA: Diagnosis not present

## 2023-04-14 DIAGNOSIS — M25561 Pain in right knee: Secondary | ICD-10-CM | POA: Diagnosis not present

## 2023-04-15 ENCOUNTER — Encounter: Payer: Self-pay | Admitting: Orthopedic Surgery

## 2023-04-15 ENCOUNTER — Other Ambulatory Visit: Payer: Self-pay | Admitting: Orthopedic Surgery

## 2023-04-15 DIAGNOSIS — J301 Allergic rhinitis due to pollen: Secondary | ICD-10-CM | POA: Diagnosis not present

## 2023-04-15 DIAGNOSIS — J3089 Other allergic rhinitis: Secondary | ICD-10-CM | POA: Diagnosis not present

## 2023-04-15 DIAGNOSIS — J3081 Allergic rhinitis due to animal (cat) (dog) hair and dander: Secondary | ICD-10-CM | POA: Diagnosis not present

## 2023-04-15 DIAGNOSIS — M25561 Pain in right knee: Secondary | ICD-10-CM

## 2023-04-21 DIAGNOSIS — J3081 Allergic rhinitis due to animal (cat) (dog) hair and dander: Secondary | ICD-10-CM | POA: Diagnosis not present

## 2023-04-21 DIAGNOSIS — J3089 Other allergic rhinitis: Secondary | ICD-10-CM | POA: Diagnosis not present

## 2023-04-21 DIAGNOSIS — J301 Allergic rhinitis due to pollen: Secondary | ICD-10-CM | POA: Diagnosis not present

## 2023-04-28 ENCOUNTER — Encounter: Payer: Self-pay | Admitting: Orthopedic Surgery

## 2023-04-29 ENCOUNTER — Other Ambulatory Visit: Payer: Self-pay

## 2023-05-01 ENCOUNTER — Other Ambulatory Visit: Payer: Self-pay

## 2023-05-06 ENCOUNTER — Ambulatory Visit (HOSPITAL_BASED_OUTPATIENT_CLINIC_OR_DEPARTMENT_OTHER): Payer: BC Managed Care – PPO | Admitting: Nurse Practitioner

## 2023-05-12 ENCOUNTER — Other Ambulatory Visit: Payer: BC Managed Care – PPO

## 2023-05-13 ENCOUNTER — Other Ambulatory Visit: Payer: BC Managed Care – PPO

## 2023-05-15 ENCOUNTER — Other Ambulatory Visit: Payer: BC Managed Care – PPO

## 2023-05-20 ENCOUNTER — Ambulatory Visit
Admission: RE | Admit: 2023-05-20 | Discharge: 2023-05-20 | Disposition: A | Discharge status: Home / Self Care | Payer: BC Managed Care – PPO | Source: Ambulatory Visit | Attending: Orthopedic Surgery | Admitting: Orthopedic Surgery

## 2023-05-20 DIAGNOSIS — M25561 Pain in right knee: Secondary | ICD-10-CM

## 2023-05-20 DIAGNOSIS — M23321 Other meniscus derangements, posterior horn of medial meniscus, right knee: Secondary | ICD-10-CM | POA: Diagnosis not present

## 2023-05-25 NOTE — Progress Notes (Deleted)
 Cardiology Office Note:  .   Date:  05/25/2023 ID:  Brent Bennett, DOB Apr 25, 1965, MRN [id number] PCP: Nelwyn Salisbury, MD [facility] Providers Cardiologist:  None   Patient Profile: .      PMH Coronary artery disease CT calcium score 12/17/22 with CAC 257 (88th percentile) Aortic atherosclerosis Dilated ascending aorta Mildly dilated ascending aorta 39 mm on CT 12/17/22 Hyperlipidemia Family history High cholesterol in both parents and siblings Maternal aunts and uncles with prior heart attack   Referred to cardiology and seen by me on 12/31/2022 as a ew patient consult for elevated coronary artery calcium score on CT 12/17/2022.  CT reveals CAC of 257 which is 88th percentile for age/race matched controls.  It also shows he has a mildly dilated aortic root and mild aortic atherosclerosis. He reports he is feeling well. We discussed his reason for undergoing CT calcium scoring.  He has been on statin for approximately 2 years.  He is overall very active including running or cycling, and playing soccer several days per week along with some weightlifting. No concerning cardiac symptoms with exercise. He denied chest pain, palpitations, shortness of breath, orthopnea, edema, PND, presyncope, syncope.  Admitted diet is not as healthy as it should be. Has young children so they tend to eat more carbohydrates with the kids.  Had a sleep test because he snores and admits to poor sleep. Sleep test showed obstruction likely from deviated septum.  He takes melatonin to help. No significant cardiac history in first-degree relatives. ASCVD risk score 5.6%. Plan to increase atorvastatin to 20 mg daily and have him return in 3 months for NMR lipid profile along with Apo B and LP(a) testing and to aim to incorporate more plant based eating into his diet and limit simple carbohydrates such as pasta.        History of Present Illness: Brent Bennett Kitchen   Brent Bennett is a very pleasant 58 y.o. male  who is  here today for follow-up of CAD.   CT for TAA  ROS: See HPI       Studies Reviewed: .           Risk Assessment/Calculations:     No BP recorded.  {Refresh Note OR Click here to enter BP  :1}***       Physical Exam:   VS: There were no vitals taken for this visit.  Wt Readings from Last 3 Encounters:  12/31/22 180 lb 11.2 oz (82 kg)  12/02/22 179 lb 3.2 oz (81.3 kg)  01/09/22 178 lb 6.4 oz (80.9 kg)     GEN: Well nourished, well developed in no acute distress NECK: No JVD; No carotid bruits CARDIAC: RRR, no murmurs, rubs, gallops RESPIRATORY:  Clear to auscultation without rales, wheezing or rhonchi  ABDOMEN: Soft, non-tender, non-distended EXTREMITIES:  No edema; No deformity     ASSESSMENT AND PLAN: .    CAD: CT calcium score completed 12/17/2022 revealed CAC score of 257, 88 percentile for age/sex matched controls.  There is no calcium in the left main or LCx.  He has nonspecific T wave abnormality on EKG today but no other EKG for comparison. He is asymptomatic. He works out on a consistent basis including regular cardiovascular exercise with no concerning cardiac symptoms. He has been on atorvastatin for several years at a low dose. Lengthy discussion about ASCVD risk and future testing.  I have asked him to notify us if he develops concerning symptoms with exercise.  Working on goal of LDL less than 70. Encouraged secondary prevention with heart healthy diet avoiding processed foods, saturated fat, simple carbohydrates, and sugar.   Hyperlipidemia LDL goal < 70/Aortic atherosclerosis: LDL 91, TGs 155, HDL 57 on  12/02/22. We will increase atorvastatin to 20 mg daily and recheck lipid panel along with Apo B and Lp(a) in 2-3 months.  Emphasized the importance of heart healthy diet along with regular physical activity.  Thoracic aortic dilatation: CT 12/28/22 revealed mildly dilated ascending aorta at 39 mm. We will plan for repeat imaging in 1 year. Aneurysm precautions  reviewed.  CV Risk Assessment: ASCVD risk score 4.9%. We discussed the components of elevated risk. He has been treating his hyperlipidemia for several years and maintains an active lifestyle. No history of smoking, diabetes, or hypertension. Family history is not significant for early CAD. We will increase intensity of statin therapy and recheck lipid panel along with apolipoprotein B and lipoprotein a in 2 to 3 months.  Plan/Goals: Work on dietary improvements to incorporate more plant based options in the place of simple carbohydrates like zucchini noodles or whole wheat pasta in the place of white pasta.   Activity:  Continue to aim for at least 150 minutes moderate intensity exercise each week.  Disposition: ***  Signed, Eligha Bridegroom, NP-C

## 2023-05-27 ENCOUNTER — Ambulatory Visit (HOSPITAL_BASED_OUTPATIENT_CLINIC_OR_DEPARTMENT_OTHER): Payer: BC Managed Care – PPO | Admitting: Nurse Practitioner

## 2023-06-09 ENCOUNTER — Ambulatory Visit (INDEPENDENT_AMBULATORY_CARE_PROVIDER_SITE_OTHER): Admitting: Family Medicine

## 2023-06-09 ENCOUNTER — Encounter: Payer: Self-pay | Admitting: Family Medicine

## 2023-06-09 VITALS — BP 120/84 | HR 73 | Temp 98.1°F | Wt 181.0 lb

## 2023-06-09 DIAGNOSIS — U071 COVID-19: Secondary | ICD-10-CM

## 2023-06-09 DIAGNOSIS — R509 Fever, unspecified: Secondary | ICD-10-CM | POA: Diagnosis not present

## 2023-06-09 LAB — POCT INFLUENZA A/B
Influenza A, POC: NEGATIVE
Influenza B, POC: NEGATIVE

## 2023-06-09 LAB — POC COVID19 BINAXNOW: SARS Coronavirus 2 Ag: POSITIVE — AB

## 2023-06-09 LAB — POCT RAPID STREP A (OFFICE): Rapid Strep A Screen: NEGATIVE — AB

## 2023-06-09 MED ORDER — NIRMATRELVIR/RITONAVIR (PAXLOVID)TABLET: 3.0000 | ORAL_TABLET | Freq: Two times a day (BID) | ORAL | 0 refills | Status: AC

## 2023-06-09 NOTE — Progress Notes (Signed)
   Subjective:    Patient ID: Brent Bennett, male    DOB: 09-11-1965, 58 y.o.   MRN: [id number]  HPI Here for 3 days of body aches, stuffy head, and a dry cough. No ST. Drinking fluids.    Review of Systems  Constitutional: Negative.   HENT:  Positive for congestion. Negative for ear pain, postnasal drip, sinus pain and sore throat.   Eyes: Negative.   Respiratory:  Positive for cough. Negative for shortness of breath and wheezing.   Gastrointestinal: Negative.        Objective:   Physical Exam Constitutional:      Appearance: Normal appearance.  HENT:     Right Ear: Tympanic membrane, ear canal and external ear normal.     Left Ear: Tympanic membrane, ear canal and external ear normal.     Nose: Nose normal.     Mouth/Throat:     Pharynx: Oropharynx is clear.  Eyes:     Conjunctiva/sclera: Conjunctivae normal.  Pulmonary:     Effort: Pulmonary effort is normal.     Breath sounds: Normal breath sounds.  Lymphadenopathy:     Cervical: No cervical adenopathy.  Neurological:     Mental Status: He is alert.           Assessment & Plan:  Covid infection. Treat with 5 days of Paxlovid.  Gershon Crane, MD

## 2023-06-09 NOTE — Addendum Note (Signed)
 Addended by: Carola Rhine on: 06/09/2023 04:24 PM   Modules accepted: Orders

## 2023-06-17 DIAGNOSIS — M65961 Unspecified synovitis and tenosynovitis, right lower leg: Secondary | ICD-10-CM | POA: Diagnosis not present

## 2023-06-17 DIAGNOSIS — X58XXXA Exposure to other specified factors, initial encounter: Secondary | ICD-10-CM | POA: Diagnosis not present

## 2023-06-17 DIAGNOSIS — M2241 Chondromalacia patellae, right knee: Secondary | ICD-10-CM | POA: Diagnosis not present

## 2023-06-17 DIAGNOSIS — S83241A Other tear of medial meniscus, current injury, right knee, initial encounter: Secondary | ICD-10-CM | POA: Diagnosis not present

## 2023-06-17 DIAGNOSIS — Y999 Unspecified external cause status: Secondary | ICD-10-CM | POA: Diagnosis not present

## 2023-06-17 DIAGNOSIS — M6751 Plica syndrome, right knee: Secondary | ICD-10-CM | POA: Diagnosis not present

## 2023-06-17 DIAGNOSIS — M65861 Other synovitis and tenosynovitis, right lower leg: Secondary | ICD-10-CM | POA: Diagnosis not present

## 2023-06-17 DIAGNOSIS — S83231A Complex tear of medial meniscus, current injury, right knee, initial encounter: Secondary | ICD-10-CM | POA: Diagnosis not present

## 2023-06-17 DIAGNOSIS — G8918 Other acute postprocedural pain: Secondary | ICD-10-CM | POA: Diagnosis not present

## 2023-06-23 DIAGNOSIS — M25561 Pain in right knee: Secondary | ICD-10-CM | POA: Diagnosis not present

## 2023-06-23 DIAGNOSIS — M25661 Stiffness of right knee, not elsewhere classified: Secondary | ICD-10-CM | POA: Diagnosis not present

## 2023-06-23 DIAGNOSIS — R531 Weakness: Secondary | ICD-10-CM | POA: Diagnosis not present

## 2023-06-23 DIAGNOSIS — Z9889 Other specified postprocedural states: Secondary | ICD-10-CM | POA: Diagnosis not present

## 2023-06-25 DIAGNOSIS — M25661 Stiffness of right knee, not elsewhere classified: Secondary | ICD-10-CM | POA: Diagnosis not present

## 2023-06-25 DIAGNOSIS — R531 Weakness: Secondary | ICD-10-CM | POA: Diagnosis not present

## 2023-06-25 DIAGNOSIS — M25561 Pain in right knee: Secondary | ICD-10-CM | POA: Diagnosis not present

## 2023-07-02 DIAGNOSIS — R531 Weakness: Secondary | ICD-10-CM | POA: Diagnosis not present

## 2023-07-02 DIAGNOSIS — M25661 Stiffness of right knee, not elsewhere classified: Secondary | ICD-10-CM | POA: Diagnosis not present

## 2023-07-02 DIAGNOSIS — M25561 Pain in right knee: Secondary | ICD-10-CM | POA: Diagnosis not present

## 2023-07-07 DIAGNOSIS — M25661 Stiffness of right knee, not elsewhere classified: Secondary | ICD-10-CM | POA: Diagnosis not present

## 2023-07-07 DIAGNOSIS — M25561 Pain in right knee: Secondary | ICD-10-CM | POA: Diagnosis not present

## 2023-07-07 DIAGNOSIS — R531 Weakness: Secondary | ICD-10-CM | POA: Diagnosis not present

## 2023-07-13 NOTE — Progress Notes (Deleted)
 Cardiology Office Note:  .   Date:  07/13/2023 ID:  [name], DOB 1965-10-11, MRN [id number] PCP: Nelwyn Salisbury, MD [facility] Providers Cardiologist:  None   Patient Profile: .      PMH Coronary artery disease CT calcium score 12/17/22 with CAC 257 (88th percentile) Aortic atherosclerosis Dilated ascending aorta Mildly dilated ascending aorta 39 mm on CT 12/17/22 Hyperlipidemia Family history High cholesterol in both parents and siblings Maternal aunts and uncles with prior heart attack     Referred to cardiology as a new patient for elevated coronary artery calcium score and seen by me on 12/31/2022. CT 12/17/2022 with CAC of 257 which is 88th percentile for age/race matched controls. Also reveals mildly dilated aortic root and mild aortic atherosclerosis. He reports he is feeling well. He has been on statin for approximately 2 years. Is overall very active including running or cycling, and playing soccer several days per week along with some weightlifting. No concerning cardiac symptoms with exercise. He denies chest pain, palpitations, shortness of breath, orthopnea, edema, PND, presyncope, syncope.  Admits diet is not as healthy as it should be. Has young children so they tend to eat more carbohydrates with the kids, like white pasta. Recently had a sleep test because he snores and admits to poor sleep. Sleep test showed obstruction likely from deviated septum.  He takes melatonin to help. ASCVD Risk score is 4.8%. He does not have history of diabetes or hypertension.     History of Present Illness: Brent Bennett   Brent Bennett is a very pleasant 58 y.o. male  who is here today for follow-up of CAD   Diet: Breakfast: yogurt and fruit Lunch: salads Dinner: chicken, steak, shrimp, admits to pasta Frequent eggs, burgers   Activity: Cycles or runs 5 days per week 1 to 2 classes per week Soccer 1 or 2 days per week  ROS: See HPI       Studies Reviewed: .            Risk Assessment/Calculations:     No BP recorded.  {Refresh Note OR Click here to enter BP  :1}***       Physical Exam:   VS: There were no vitals taken for this visit.  Wt Readings from Last 3 Encounters:  06/09/23 181 lb (82.1 kg)  12/31/22 180 lb 11.2 oz (82 kg)  12/02/22 179 lb 3.2 oz (81.3 kg)     GEN: Well nourished, well developed in no acute distress NECK: No JVD; No carotid bruits CARDIAC: RRR, no murmurs, rubs, gallops RESPIRATORY:  Clear to auscultation without rales, wheezing or rhonchi  ABDOMEN: Soft, non-tender, non-distended EXTREMITIES:  No edema; No deformity     ASSESSMENT AND PLAN: .    CAD: CT calcium score completed 12/17/2022 revealed CAC score of 257, 88 percentile for age/sex matched controls.  There is no calcium in the left main or LCx.  He has nonspecific T wave abnormality on EKG today but no other EKG for comparison. He is asymptomatic. He works out on a consistent basis including regular cardiovascular exercise with no concerning cardiac symptoms. He has been on atorvastatin for several years at a low dose. Lengthy discussion about ASCVD risk and future testing.  I have asked him to notify us if he develops concerning symptoms with exercise.  Working on goal of LDL less than 70. Encouraged secondary prevention with heart healthy diet avoiding processed foods, saturated fat, simple carbohydrates, and sugar.  Hyperlipidemia LDL goal < 70/Aortic atherosclerosis: LDL 91, TGs 155, HDL 57 on  12/02/22. We will increase atorvastatin to 20 mg daily and recheck lipid panel along with Apo B and Lp(a) in 2-3 months.  Emphasized the importance of heart healthy diet along with regular physical activity.  Thoracic aortic dilatation: CT 12/28/22 revealed mildly dilated ascending aorta at 39 mm. We will plan for repeat imaging in 1 year. Aneurysm precautions reviewed.  CV Risk Assessment: ASCVD risk score 4.9%. We discussed the components of elevated risk. He has been  treating his hyperlipidemia for several years and maintains an active lifestyle. No history of smoking, diabetes, or hypertension. Family history is not significant for early CAD. We will increase intensity of statin therapy and recheck lipid panel along with apolipoprotein B and lipoprotein a in 2 to 3 months.  Plan/Goals: Work on dietary improvements to incorporate more plant based options in the place of simple carbohydrates like zucchini noodles or whole wheat pasta in the place of white pasta.   Activity:  Continue to aim for at least 150 minutes moderate intensity exercise each week.    Dispo: 3 months with me  Signed, Eligha Bridegroom, NP-C

## 2023-07-15 ENCOUNTER — Ambulatory Visit (HOSPITAL_BASED_OUTPATIENT_CLINIC_OR_DEPARTMENT_OTHER): Payer: BC Managed Care – PPO | Admitting: Nurse Practitioner

## 2023-07-15 DIAGNOSIS — M25561 Pain in right knee: Secondary | ICD-10-CM | POA: Diagnosis not present

## 2023-07-15 DIAGNOSIS — R531 Weakness: Secondary | ICD-10-CM | POA: Diagnosis not present

## 2023-07-15 DIAGNOSIS — M25661 Stiffness of right knee, not elsewhere classified: Secondary | ICD-10-CM | POA: Diagnosis not present

## 2023-07-22 DIAGNOSIS — M25661 Stiffness of right knee, not elsewhere classified: Secondary | ICD-10-CM | POA: Diagnosis not present

## 2023-07-22 DIAGNOSIS — M25561 Pain in right knee: Secondary | ICD-10-CM | POA: Diagnosis not present

## 2023-07-22 DIAGNOSIS — R531 Weakness: Secondary | ICD-10-CM | POA: Diagnosis not present

## 2023-07-24 ENCOUNTER — Encounter: Payer: Self-pay | Admitting: Family Medicine

## 2023-07-27 DIAGNOSIS — M2022 Hallux rigidus, left foot: Secondary | ICD-10-CM | POA: Diagnosis not present

## 2023-07-27 DIAGNOSIS — M2021 Hallux rigidus, right foot: Secondary | ICD-10-CM | POA: Diagnosis not present

## 2023-07-28 DIAGNOSIS — M25661 Stiffness of right knee, not elsewhere classified: Secondary | ICD-10-CM | POA: Diagnosis not present

## 2023-07-28 DIAGNOSIS — M25561 Pain in right knee: Secondary | ICD-10-CM | POA: Diagnosis not present

## 2023-07-28 DIAGNOSIS — R531 Weakness: Secondary | ICD-10-CM | POA: Diagnosis not present

## 2023-08-28 ENCOUNTER — Other Ambulatory Visit: Payer: Self-pay | Admitting: Family Medicine

## 2023-08-28 NOTE — Telephone Encounter (Signed)
 Pt LOV was 06/09/23 Last visit was done on 03/19/2023 Please advise

## 2023-09-10 DIAGNOSIS — M25561 Pain in right knee: Secondary | ICD-10-CM | POA: Diagnosis not present

## 2023-09-10 DIAGNOSIS — R531 Weakness: Secondary | ICD-10-CM | POA: Diagnosis not present

## 2023-09-10 DIAGNOSIS — M25661 Stiffness of right knee, not elsewhere classified: Secondary | ICD-10-CM | POA: Diagnosis not present

## 2023-10-05 ENCOUNTER — Ambulatory Visit (HOSPITAL_BASED_OUTPATIENT_CLINIC_OR_DEPARTMENT_OTHER): Admitting: Nurse Practitioner

## 2023-10-21 ENCOUNTER — Other Ambulatory Visit (HOSPITAL_BASED_OUTPATIENT_CLINIC_OR_DEPARTMENT_OTHER): Payer: Self-pay | Admitting: *Deleted

## 2023-10-21 ENCOUNTER — Other Ambulatory Visit

## 2023-10-21 DIAGNOSIS — E785 Hyperlipidemia, unspecified: Secondary | ICD-10-CM

## 2023-10-21 DIAGNOSIS — I2510 Atherosclerotic heart disease of native coronary artery without angina pectoris: Secondary | ICD-10-CM

## 2023-10-21 DIAGNOSIS — I77810 Thoracic aortic ectasia: Secondary | ICD-10-CM

## 2023-10-21 DIAGNOSIS — I700 Atherosclerosis of aorta: Secondary | ICD-10-CM

## 2023-10-22 LAB — NMR, LIPOPROFILE
Cholesterol, Total: 189 mg/dL (ref 100–199)
HDL Particle Number: 44.6 umol/L (ref >=30.5)
HDL-C: 65 mg/dL (ref >39)
LDL Particle Number: 983 nmol/L (ref <1000)
LDL Size: 21.1 nm (ref >20.5)
LDL-C (NIH Calc): 100 mg/dL — ABNORMAL HIGH (ref 0–99)
LP-IR Score: 56 — ABNORMAL HIGH (ref <=45)
Small LDL Particle Number: 401 nmol/L (ref <=527)
Triglycerides: 138 mg/dL (ref 0–149)

## 2023-10-22 LAB — LIPOPROTEIN A (LPA): Lipoprotein (a): 19.6 nmol/L (ref <75.0)

## 2023-10-22 LAB — APOLIPOPROTEIN B: Apolipoprotein B: 88 mg/dL (ref <90)

## 2023-10-23 ENCOUNTER — Ambulatory Visit (INDEPENDENT_AMBULATORY_CARE_PROVIDER_SITE_OTHER): Admitting: Family

## 2023-10-23 ENCOUNTER — Encounter (HOSPITAL_BASED_OUTPATIENT_CLINIC_OR_DEPARTMENT_OTHER): Payer: Self-pay | Admitting: Family

## 2023-10-23 ENCOUNTER — Ambulatory Visit: Payer: Self-pay | Admitting: Nurse Practitioner

## 2023-10-23 VITALS — BP 110/70 | HR 75 | Ht 69.0 in | Wt 180.0 lb

## 2023-10-23 DIAGNOSIS — E785 Hyperlipidemia, unspecified: Secondary | ICD-10-CM | POA: Diagnosis not present

## 2023-10-23 DIAGNOSIS — I25118 Atherosclerotic heart disease of native coronary artery with other forms of angina pectoris: Secondary | ICD-10-CM

## 2023-10-23 MED ORDER — ATORVASTATIN CALCIUM 40 MG PO TABS: 40.0000 mg | ORAL_TABLET | Freq: Every day | ORAL | 3 refills | Status: AC

## 2023-10-23 NOTE — Progress Notes (Signed)
 Cardiology Office Note   Date:  10/26/2023  ID:  Brent Bennett, DOB [birth year], MRN [id number] PCP: Brent Garnette DELENA, MD  [facility] Providers Cardiologist:  None Cardiology APP:  Brent Rosaline HERO, NP Brent Brent RAMAN, NP     History of Present Illness Brent Bennett is a 58 y.o. male with history of coronary artery disease, mildly dilated aortic root, aortic atherosclerosis, hyperlipidemia.  Prior sleep study with no sleep apnea but obstruction and snoring likely from deviated septum.  Calcium  score 12/17/2022 calcium  score of 257 placing him in the 88th percentile (LAD 126, RCA 130) with mildly dilated aortic root 39 mm.  Presents today for follow-up independently.  Remains very active playing soccer for exercise and working out at the gym.  Had knee surgery couple of months ago and has been gradually increasing activity.  We reviewed lipid panel 10/21/2023 with APO B88, LP(a) 19.6, LDL 100.  Significant portion of visit spent discussing dietary changes for management of cholesterol as he frequently eats out due to work.  Reports no shortness of breath nor dyspnea on exertion. Reports no chest pain, pressure, or tightness. No edema, orthopnea, PND. Reports no palpitations.    ROS: Please see the history of present illness.    All other systems reviewed and are negative.   Studies Reviewed      Cardiac Studies & Procedures   ______________________________________________________________________________________________          CT SCANS  CT CARDIAC SCORING (SELF PAY ONLY) 12/17/2022  Addendum 12/28/2022 11:54 AM ADDENDUM REPORT: 12/28/2022 11:51  EXAM: OVER-READ INTERPRETATION  CT CHEST  The following report is an over-read performed by radiologist Dr. Fonda Mom [facility] on 12/28/2022. This over-read does not include interpretation of cardiac or coronary anatomy or pathology. The coronary calcium  score interpretation by the  cardiologist is attached.  COMPARISON:  None.  FINDINGS: Cardiovascular: Atheromatous calcifications of coronary arteries and aorta. See findings discussed in the body of the report.  Mediastinum/Nodes: No suspicious adenopathy identified. Imaged mediastinal structures are unremarkable.  Lungs/Pleura: Imaged lungs are clear. No pleural effusion or pneumothorax.  Upper Abdomen: No acute abnormality.  Musculoskeletal: No chest wall abnormality. There are thoracic degenerative changes.  IMPRESSION: No significant extracardiac incidental findings identified.   Electronically Signed By: Brent Field M.D. On: 12/28/2022 11:51  Narrative CLINICAL DATA:  Cardiovascular Disease Risk stratification  EXAM: Coronary Calcium  Score  TECHNIQUE: A gated, non-contrast computed tomography scan of the heart was performed using 3mm slice thickness. Axial images were analyzed on a dedicated workstation. Calcium  scoring of the coronary arteries was performed using the Agatston method.  FINDINGS: Coronary Calcium  Score:  Left main: 0  Left anterior descending artery: 126  Left circumflex artery: 0  Right coronary artery: 130  Total: 257  Percentile: 88  Pericardium: Normal.  Ascending Aorta: Mildly dilated (39 mm). Mild aortic atherosclerosis.  Non-cardiac: See separate report from [facility].  IMPRESSION: Coronary calcium  score of 257. This was 105 percentile for age-, race-, and sex-matched controls.  Mildly dilated aortic root (39 mm).  Mild aortic atherosclerosis.  RECOMMENDATIONS: Coronary artery calcium  (CAC) score is a strong predictor of incident coronary heart disease (CHD) and provides predictive information beyond traditional risk factors. CAC scoring is reasonable to use in the decision to withhold, postpone, or initiate statin therapy in intermediate-risk or selected borderline-risk asymptomatic adults (age 21-75 years and LDL-C >=70 to  <190 mg/dL) who do not have diabetes or established atherosclerotic cardiovascular disease (ASCVD).*  In intermediate-risk (10-year ASCVD risk >=7.5% to <20%) adults or selected borderline-risk (10-year ASCVD risk >=5% to <7.5%) adults in whom a CAC score is measured for the purpose of making a treatment decision the following recommendations have been made:  If CAC=0, it is reasonable to withhold statin therapy and reassess in 5 to 10 years, as long as higher risk conditions are absent (diabetes mellitus, family history of premature CHD in first degree relatives (males <55 years; females <65 years), cigarette smoking, or LDL >=190 mg/dL).  If CAC is 1 to 99, it is reasonable to initiate statin therapy for patients >=40 years of age.  If CAC is >=100 or >=75th percentile, it is reasonable to initiate statin therapy at any age.  Cardiology referral should be considered for patients with CAC scores >=400 or >=75th percentile.  *2018 AHA/ACC/AACVPR/AAPA/ABC/ACPM/ADA/AGS/APhA/ASPC/NLA/PCNA Guideline on the Management of Blood Cholesterol: A Report of the American College of Cardiology/American Heart Association Task Force on Clinical Practice Guidelines. J Am Coll Cardiol. 2019;73(24):3168-3209.  Brent Shallow, MD  Electronically Signed: By: Brent Bennett M.D. On: 12/17/2022 09:00     ______________________________________________________________________________________________      Risk Assessment/Calculations          Physical Exam VS:  BP 110/70 (BP Location: Right Arm, Patient Position: Sitting, Cuff Size: Normal)   Pulse 75   Ht 5' 9 (1.753 m)   Wt 180 lb (81.6 kg)   SpO2 98%   BMI 26.58 kg/m        Wt Readings from Last 3 Encounters:  10/23/23 180 lb (81.6 kg)  06/09/23 181 lb (82.1 kg)  12/31/22 180 lb 11.2 oz (82 kg)    GEN: Well nourished, well developed in no acute distress NECK: No JVD; No carotid bruits CARDIAC: RRR, no murmurs, rubs,  gallops RESPIRATORY:  Clear to auscultation without rales, wheezing or rhonchi  ABDOMEN: Soft, non-tender, non-distended EXTREMITIES:  No edema; No deformity   ASSESSMENT AND PLAN  CAD / HLD, LDL goal <70 - Stable with no anginal symptoms. No indication for ischemic evaluation.  Normal LPA with no evidence of familial hyperlipidemia.  LDL not at goal of less than 70. Increase atorvastatin  from 20 mg to 40 mg daily. NMR, LFT a week prior to follow up in 3 months Recommend aiming for 150 minutes of moderate intensity activity per week and following a heart healthy diet.  Discussed tips to eat out (limit sauces/dressings, focus on smaller portions, limit fried foods). Handouts provided from [facility] Lipid Association.  If LDL remains >70 consider further increasing Atorvasatin vs addition of Nexlizet.       Dispo: follow up in 3 months with Brent GORMAN Finder, NP or Rosaline Bane, NP  Signed, Brent GORMAN Finder, NP

## 2023-10-23 NOTE — Patient Instructions (Signed)
 Medication Instructions:  CHANGE Atorvastatin  to 40mg  daily New prescription sent to your pharmacy   *If you need a refill on your cardiac medications before your next appointment, please call your pharmacy*  Lab Work: Your physician recommends that you return for lab work in 3 months for [employer] panel, LFT a week prior to your next clinic visit  If you have labs (blood work) drawn today and your tests are completely normal, you will receive your results only by: MyChart Message (if you have MyChart) OR A paper copy in the mail If you have any lab test that is abnormal or we need to change your treatment, we will call you to review the results.  Follow-Up: At [facility], you and your health needs are our priority.  As part of our continuing mission to provide you with exceptional heart care, our providers are all part of one team.  This team includes your primary Cardiologist (physician) and Advanced Practice Providers or APPs (Physician Assistants and Nurse Practitioners) who all work together to provide you with the care you need, when you need it.  Your next appointment:   3 month(s)  Provider:   Rosaline Bane, NP or Reche Finder, NP    We recommend signing up for the patient portal called MyChart.  Sign up information is provided on this After Visit Summary.  MyChart is used to connect with patients for Virtual Visits (Telemedicine).  Patients are able to view lab/test results, encounter notes, upcoming appointments, etc.  Non-urgent messages can be sent to your provider as well.   To learn more about what you can do with MyChart, go to [email or web address].   Other Instructions       Foods to help lower cholesterol: Nuts (walnuts, cashews, almonds) Fish (like salmon) Avocado or avocado oil Olive oil Adding lots of fiber

## 2023-12-18 DIAGNOSIS — J301 Allergic rhinitis due to pollen: Secondary | ICD-10-CM | POA: Diagnosis not present

## 2023-12-18 DIAGNOSIS — H1045 Other chronic allergic conjunctivitis: Secondary | ICD-10-CM | POA: Diagnosis not present

## 2023-12-18 DIAGNOSIS — J3089 Other allergic rhinitis: Secondary | ICD-10-CM | POA: Diagnosis not present

## 2023-12-18 DIAGNOSIS — J3081 Allergic rhinitis due to animal (cat) (dog) hair and dander: Secondary | ICD-10-CM | POA: Diagnosis not present

## 2023-12-30 ENCOUNTER — Ambulatory Visit (INDEPENDENT_AMBULATORY_CARE_PROVIDER_SITE_OTHER): Admitting: Family Medicine

## 2023-12-30 VITALS — BP 110/76 | HR 65 | Temp 98.0°F | Ht 68.5 in | Wt 180.0 lb

## 2023-12-30 DIAGNOSIS — Z23 Encounter for immunization: Secondary | ICD-10-CM

## 2023-12-30 DIAGNOSIS — I2510 Atherosclerotic heart disease of native coronary artery without angina pectoris: Secondary | ICD-10-CM | POA: Diagnosis not present

## 2023-12-30 DIAGNOSIS — I700 Atherosclerosis of aorta: Secondary | ICD-10-CM | POA: Insufficient documentation

## 2023-12-30 DIAGNOSIS — Z0000 Encounter for general adult medical examination without abnormal findings: Secondary | ICD-10-CM | POA: Diagnosis not present

## 2023-12-30 LAB — CBC WITH DIFFERENTIAL/PLATELET
Basophils Absolute: 0 K/uL (ref 0.0–0.1)
Basophils Relative: 0.7 % (ref 0.0–3.0)
Eosinophils Absolute: 0.1 K/uL (ref 0.0–0.7)
Eosinophils Relative: 3.1 % (ref 0.0–5.0)
HCT: 45.7 % (ref 39.0–52.0)
Hemoglobin: 15.2 g/dL (ref 13.0–17.0)
Lymphocytes Relative: 29.2 % (ref 12.0–46.0)
Lymphs Abs: 1.2 K/uL (ref 0.7–4.0)
MCHC: 33.3 g/dL (ref 30.0–36.0)
MCV: 90.6 fl (ref 78.0–100.0)
Monocytes Absolute: 0.3 K/uL (ref 0.1–1.0)
Monocytes Relative: 7.9 % (ref 3.0–12.0)
Neutro Abs: 2.4 K/uL (ref 1.4–7.7)
Neutrophils Relative %: 59.1 % (ref 43.0–77.0)
Platelets: 222 K/uL (ref 150.0–400.0)
RBC: 5.04 Mil/uL (ref 4.22–5.81)
RDW: 13 % (ref 11.5–15.5)
WBC: 4.1 K/uL (ref 4.0–10.5)

## 2023-12-30 LAB — BASIC METABOLIC PANEL WITH GFR
BUN: 20 mg/dL (ref 6–23)
CO2: 30 meq/L (ref 19–32)
Calcium: 9.4 mg/dL (ref 8.4–10.5)
Chloride: 104 meq/L (ref 96–112)
Creatinine, Ser: 0.84 mg/dL (ref 0.40–1.50)
GFR: 96.35 mL/min (ref >60.00)
Glucose, Bld: 108 mg/dL — ABNORMAL HIGH (ref 70–99)
Potassium: 4 meq/L (ref 3.5–5.1)
Sodium: 141 meq/L (ref 135–145)

## 2023-12-30 LAB — PSA: PSA: 2.12 ng/mL (ref 0.10–4.00)

## 2023-12-30 LAB — LIPID PANEL
Cholesterol: 173 mg/dL (ref 0–200)
HDL: 63.5 mg/dL (ref >39.00)
LDL Cholesterol: 83 mg/dL (ref 0–99)
NonHDL: 109.99
Total CHOL/HDL Ratio: 3
Triglycerides: 135 mg/dL (ref 0.0–149.0)
VLDL: 27 mg/dL (ref 0.0–40.0)

## 2023-12-30 LAB — HEPATIC FUNCTION PANEL
ALT: 27 U/L (ref 0–53)
AST: 24 U/L (ref 0–37)
Albumin: 4.6 g/dL (ref 3.5–5.2)
Alkaline Phosphatase: 85 U/L (ref 39–117)
Bilirubin, Direct: 0.3 mg/dL (ref 0.0–0.3)
Total Bilirubin: 1.6 mg/dL — ABNORMAL HIGH (ref 0.2–1.2)
Total Protein: 7.1 g/dL (ref 6.0–8.3)

## 2023-12-30 LAB — TSH: TSH: 1.86 u[IU]/mL (ref 0.35–5.50)

## 2023-12-30 LAB — HEMOGLOBIN A1C: Hgb A1c MFr Bld: 5.9 % (ref 4.6–6.5)

## 2023-12-30 MED ORDER — ZOLPIDEM TARTRATE 10 MG PO TABS: 10.0000 mg | ORAL_TABLET | Freq: Every day | ORAL | 5 refills | Status: AC

## 2023-12-30 NOTE — Progress Notes (Signed)
 Subjective:    Patient ID: Brent Bennett, male    DOB: 1965-10-06, 58 y.o.   MRN: [id number]  HPI Here for a well exam. He is doing well in general. He saw Cardiology a few weeks ago after he had a cardiac CT calcium  score of 257. They increased his Atorvastatin  dose to 40 mg daily because his LDL was 100 (their goal is below 70).    Review of Systems  Constitutional: Negative.   HENT: Negative.    Eyes: Negative.   Respiratory: Negative.    Cardiovascular: Negative.   Gastrointestinal: Negative.   Genitourinary: Negative.   Musculoskeletal: Negative.   Skin: Negative.   Neurological: Negative.   Psychiatric/Behavioral: Negative.         Objective:   Physical Exam Constitutional:      General: He is not in acute distress.    Appearance: Normal appearance. He is well-developed. He is not diaphoretic.  HENT:     Head: Normocephalic and atraumatic.     Right Ear: External ear normal.     Left Ear: External ear normal.     Nose: Nose normal.     Mouth/Throat:     Pharynx: No oropharyngeal exudate.  Eyes:     General: No scleral icterus.       Right eye: No discharge.        Left eye: No discharge.     Conjunctiva/sclera: Conjunctivae normal.     Pupils: Pupils are equal, round, and reactive to light.  Neck:     Thyroid : No thyromegaly.     Vascular: No JVD.     Trachea: No tracheal deviation.  Cardiovascular:     Rate and Rhythm: Normal rate and regular rhythm.     Pulses: Normal pulses.     Heart sounds: Normal heart sounds. No murmur heard.    No friction rub. No gallop.  Pulmonary:     Effort: Pulmonary effort is normal. No respiratory distress.     Breath sounds: Normal breath sounds. No wheezing or rales.  Chest:     Chest wall: No tenderness.  Abdominal:     General: Bowel sounds are normal. There is no distension.     Palpations: Abdomen is soft. There is no mass.     Tenderness: There is no abdominal tenderness. There is no guarding or rebound.   Genitourinary:    Penis: Normal. No tenderness.      Testes: Normal.     Prostate: Normal.     Rectum: Normal. Guaiac result negative.  Musculoskeletal:        General: No tenderness. Normal range of motion.     Cervical back: Neck supple.  Lymphadenopathy:     Cervical: No cervical adenopathy.  Skin:    General: Skin is warm and dry.     Coloration: Skin is not pale.     Findings: No erythema or rash.  Neurological:     General: No focal deficit present.     Mental Status: He is alert and oriented to person, place, and time.     Cranial Nerves: No cranial nerve deficit.     Motor: No abnormal muscle tone.     Coordination: Coordination normal.     Deep Tendon Reflexes: Reflexes are normal and symmetric. Reflexes normal.  Psychiatric:        Mood and Affect: Mood normal.        Behavior: Behavior normal.        Thought Content:  Thought content normal.        Judgment: Judgment normal.           Assessment & Plan:  Well exam. We discussed diet and exercise. Get fasting labs. Garnette Olmsted, MD

## 2023-12-30 NOTE — Addendum Note (Signed)
 Addended by: LADONNA INOCENTE SAILOR on: 12/30/2023 12:56 PM   Modules accepted: Orders

## 2023-12-31 ENCOUNTER — Ambulatory Visit: Payer: Self-pay | Admitting: Family Medicine

## 2024-01-19 ENCOUNTER — Ambulatory Visit (HOSPITAL_BASED_OUTPATIENT_CLINIC_OR_DEPARTMENT_OTHER): Admitting: Family

## 2024-01-20 DIAGNOSIS — L570 Actinic keratosis: Secondary | ICD-10-CM | POA: Diagnosis not present

## 2024-01-20 DIAGNOSIS — D225 Melanocytic nevi of trunk: Secondary | ICD-10-CM | POA: Diagnosis not present

## 2024-01-20 DIAGNOSIS — L821 Other seborrheic keratosis: Secondary | ICD-10-CM | POA: Diagnosis not present

## 2024-01-20 DIAGNOSIS — D2372 Other benign neoplasm of skin of left lower limb, including hip: Secondary | ICD-10-CM | POA: Diagnosis not present

## 2024-01-20 DIAGNOSIS — D485 Neoplasm of uncertain behavior of skin: Secondary | ICD-10-CM | POA: Diagnosis not present

## 2024-01-20 DIAGNOSIS — L578 Other skin changes due to chronic exposure to nonionizing radiation: Secondary | ICD-10-CM | POA: Diagnosis not present

## 2024-02-08 ENCOUNTER — Ambulatory Visit (HOSPITAL_BASED_OUTPATIENT_CLINIC_OR_DEPARTMENT_OTHER): Admitting: Family

## 2024-02-11 ENCOUNTER — Other Ambulatory Visit (HOSPITAL_BASED_OUTPATIENT_CLINIC_OR_DEPARTMENT_OTHER): Payer: Self-pay | Admitting: Nurse Practitioner

## 2024-03-10 ENCOUNTER — Telehealth: Payer: Self-pay | Admitting: Family Medicine

## 2024-03-10 MED ORDER — AZITHROMYCIN 250 MG PO TABS: ORAL_TABLET | ORAL | 0 refills | Status: AC

## 2024-03-10 NOTE — Telephone Encounter (Signed)
He has a sinus infection.  

## 2024-04-18 ENCOUNTER — Ambulatory Visit (HOSPITAL_BASED_OUTPATIENT_CLINIC_OR_DEPARTMENT_OTHER): Admitting: Family

## 2024-04-22 ENCOUNTER — Ambulatory Visit (HOSPITAL_BASED_OUTPATIENT_CLINIC_OR_DEPARTMENT_OTHER): Admitting: Family

## 2024-05-17 ENCOUNTER — Ambulatory Visit (HOSPITAL_BASED_OUTPATIENT_CLINIC_OR_DEPARTMENT_OTHER): Admitting: Family
